# Patient Record
Sex: Male | Born: 1955 | Race: White | Hispanic: No | Marital: Married | Smoking: Never smoker
Health system: Southern US, Community
[De-identification: ages and names within clinical notes are randomized; demographics above are authoritative.]

## PROBLEM LIST (undated history)

## (undated) DIAGNOSIS — J45909 Unspecified asthma, uncomplicated: Secondary | ICD-10-CM

## (undated) DIAGNOSIS — Z87442 Personal history of urinary calculi: Secondary | ICD-10-CM

## (undated) DIAGNOSIS — T7840XA Allergy, unspecified, initial encounter: Secondary | ICD-10-CM

## (undated) DIAGNOSIS — N433 Hydrocele, unspecified: Secondary | ICD-10-CM

## (undated) DIAGNOSIS — F32A Depression, unspecified: Secondary | ICD-10-CM

## (undated) DIAGNOSIS — C801 Malignant (primary) neoplasm, unspecified: Secondary | ICD-10-CM

## (undated) DIAGNOSIS — M419 Scoliosis, unspecified: Secondary | ICD-10-CM

## (undated) DIAGNOSIS — F419 Anxiety disorder, unspecified: Secondary | ICD-10-CM

## (undated) DIAGNOSIS — I10 Essential (primary) hypertension: Secondary | ICD-10-CM

## (undated) DIAGNOSIS — E079 Disorder of thyroid, unspecified: Secondary | ICD-10-CM

## (undated) HISTORY — DX: Depression, unspecified: F32.A

## (undated) HISTORY — PX: COLONOSCOPY: SHX174

## (undated) HISTORY — PX: LITHOTRIPSY: SUR834

## (undated) HISTORY — PX: VASECTOMY: SHX75

## (undated) HISTORY — DX: Unspecified asthma, uncomplicated: J45.909

## (undated) HISTORY — DX: Essential (primary) hypertension: I10

## (undated) HISTORY — DX: Disorder of thyroid, unspecified: E07.9

## (undated) HISTORY — DX: Allergy, unspecified, initial encounter: T78.40XA

## (undated) HISTORY — DX: Malignant (primary) neoplasm, unspecified: C80.1

## (undated) HISTORY — DX: Anxiety disorder, unspecified: F41.9

## (undated) HISTORY — PX: MOHS SURGERY: SHX181

---

## 2009-06-19 DIAGNOSIS — Z87442 Personal history of urinary calculi: Secondary | ICD-10-CM

## 2009-06-19 HISTORY — DX: Personal history of urinary calculi: Z87.442

## 2009-10-24 ENCOUNTER — Emergency Department (HOSPITAL_COMMUNITY): Admission: EM | Admit: 2009-10-24 | Discharge: 2009-10-24 | Payer: Self-pay | Admitting: Emergency Medicine

## 2009-10-25 ENCOUNTER — Ambulatory Visit (HOSPITAL_COMMUNITY): Admission: AD | Admit: 2009-10-25 | Discharge: 2009-10-25 | Payer: Self-pay | Admitting: Urology

## 2010-01-20 ENCOUNTER — Ambulatory Visit (HOSPITAL_COMMUNITY): Admission: RE | Admit: 2010-01-20 | Discharge: 2010-01-20 | Payer: Self-pay | Admitting: Urology

## 2010-03-03 ENCOUNTER — Encounter (INDEPENDENT_AMBULATORY_CARE_PROVIDER_SITE_OTHER): Payer: Self-pay | Admitting: *Deleted

## 2010-03-21 ENCOUNTER — Ambulatory Visit: Payer: Self-pay | Admitting: Family Medicine

## 2010-03-21 DIAGNOSIS — R519 Headache, unspecified: Secondary | ICD-10-CM | POA: Insufficient documentation

## 2010-03-21 DIAGNOSIS — M4120 Other idiopathic scoliosis, site unspecified: Secondary | ICD-10-CM | POA: Insufficient documentation

## 2010-03-21 DIAGNOSIS — Z87442 Personal history of urinary calculi: Secondary | ICD-10-CM | POA: Insufficient documentation

## 2010-03-21 DIAGNOSIS — R51 Headache: Secondary | ICD-10-CM | POA: Insufficient documentation

## 2010-03-22 LAB — CONVERTED CEMR LAB
AST: 28 units/L (ref 0–37)
Albumin: 4.7 g/dL (ref 3.5–5.2)
Bilirubin, Direct: 0.1 mg/dL (ref 0.0–0.3)
Calcium: 9.4 mg/dL (ref 8.4–10.5)
Chloride: 100 meq/L (ref 96–112)
Free T4: 0.66 ng/dL (ref 0.60–1.60)
GFR calc non Af Amer: 103.38 mL/min (ref >60)
Glucose, Bld: 85 mg/dL (ref 70–99)
Lymphs Abs: 2.1 10*3/uL (ref 0.7–4.0)
Monocytes Absolute: 0.6 10*3/uL (ref 0.1–1.0)
PSA: 0.74 ng/mL (ref 0.10–4.00)
Potassium: 4 meq/L (ref 3.5–5.1)
RBC: 4.48 M/uL (ref 4.22–5.81)
Sodium: 138 meq/L (ref 135–145)
T3, Free: 2.9 pg/mL (ref 2.3–4.2)
Total Bilirubin: 0.7 mg/dL (ref 0.3–1.2)
Total CHOL/HDL Ratio: 4
Total Protein: 7.6 g/dL (ref 6.0–8.3)
Triglycerides: 253 mg/dL — ABNORMAL HIGH (ref 0.0–149.0)
WBC: 8.1 10*3/uL (ref 4.5–10.5)

## 2010-03-23 ENCOUNTER — Encounter: Payer: Self-pay | Admitting: Gastroenterology

## 2010-04-18 ENCOUNTER — Encounter (INDEPENDENT_AMBULATORY_CARE_PROVIDER_SITE_OTHER): Payer: Self-pay | Admitting: *Deleted

## 2010-04-20 ENCOUNTER — Ambulatory Visit: Payer: Self-pay | Admitting: Gastroenterology

## 2010-05-04 ENCOUNTER — Ambulatory Visit: Payer: Self-pay | Admitting: Gastroenterology

## 2010-05-06 ENCOUNTER — Encounter: Payer: Self-pay | Admitting: Gastroenterology

## 2010-07-19 NOTE — Assessment & Plan Note (Signed)
Summary: NEW TO EST,WANTS CPX,UHC/RH.......  Flu Vaccine Consent Questions     Do you have a history of severe allergic reactions to this vaccine? no    Any prior history of allergic reactions to egg and/or gelatin? no    Do you have a sensitivity to the preservative Thimersol? no    Do you have a past history of Guillan-Barre Syndrome? no    Do you currently have an acute febrile illness? no    Have you ever had a severe reaction to latex? no    Vaccine information given and explained to patient? yes    Are you currently pregnant? no    Lot Number:[id number]   Exp Date:12/17/2010   Site Given  Right Deltoid IM    Vital Signs:  Patient profile:   55 year old male Height:      70.25 inches Weight:      210 pounds BMI:     30.03 Pulse rate:   74 / minute BP sitting:   112 / 80  (left arm)  Vitals Entered By: Doristine Devoid CMA (March 21, 2010 10:34 AM) CC: NEW EST- CPX AND LABS   History of Present Illness: 55 yo man here today for CPE.  previous MD- none.  Urologist- [facility]  scoliosis- sees Chiropractic regularly.  causes frequent headaches.  previously on muscle relaxers and vicodin with good relief.  Preventive Screening-Counseling & Management  Alcohol-Tobacco     Alcohol drinks/day: <1     Smoking Status: never  Caffeine-Diet-Exercise     Does Patient Exercise: yes     Type of exercise: walking      Sexual History:  currently monogamous.        Drug Use:  never.    Current Medications (verified): 1)  None  Allergies (verified): No Known Drug Allergies  Past History:  Past Medical History: Scoliosis- Dr. Melvyn Neth (Chiropractor) Nephrolithiasis, hx of  Past Surgical History: Lithotripsy-2011, Dr. 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  The patient denies anorexia, fever, weight loss, weight gain, vision loss, decreased hearing, hoarseness, chest pain, syncope, dyspnea on exertion, peripheral edema, prolonged cough, abdominal pain, melena, hematochezia, severe indigestion/heartburn, hematuria, suspicious skin lesions, depression, abnormal bleeding, enlarged lymph nodes, and testicular masses.    Physical Exam  General:  Well-developed,well-nourished,in no acute distress; alert,appropriate and cooperative throughout examination Head:  Normocephalic and atraumatic without obvious abnormalities. Eyes:  PERRL, EOMI, wearing glasses Ears:  External ear exam shows no significant lesions or deformities.  Otoscopic examination reveals clear canals, tympanic membranes are intact bilaterally without bulging, retraction, inflammation or discharge. Hearing is grossly normal bilaterally. Nose:  External nasal examination shows no deformity or inflammation. Nasal mucosa are pink and moist without lesions or exudates. Mouth:  Oral mucosa and oropharynx without lesions or exudates. Neck:  No deformities, masses, or tenderness noted. Lungs:  Normal respiratory effort, chest expands symmetrically. Lungs are clear to auscultation, no crackles or wheezes. Heart:  reg S1/S2, no M/R/G Abdomen:  Bowel sounds positive,abdomen soft and non-tender without masses, organomegaly or hernias noted. Rectal:  No external abnormalities noted. Normal sphincter tone. No rectal masses or tenderness. Genitalia:  Testes bilaterally descended without nodularity,  tenderness or masses. No scrotal masses or lesions. No penis lesions or urethral discharge. Prostate:  Prostate gland firm and smooth, no enlargement, nodularity, tenderness, mass, asymmetry or induration. Pulses:  +2 carotid, radial, DP Extremities:  No clubbing, cyanosis, edema, or deformity noted with normal full  range of motion of all joints.   Neurologic:  No cranial nerve deficits noted. Station and gait are normal. Plantar reflexes are down-going bilaterally. DTRs are symmetrical throughout. Sensory, motor and coordinative functions appear intact. Skin:  Intact without suspicious lesions or rashes Cervical Nodes:  No lymphadenopathy noted Inguinal Nodes:  No significant adenopathy Psych:  Cognition and judgment appear intact. Alert and cooperative with normal attention span and concentration. No apparent delusions, illusions, hallucinations   Impression & Recommendations:  Problem # 1:  PHYSICAL EXAMINATION (ICD-V70.0) Assessment New pt's PE WNL.  check labs.  due for colonoscopy- will refer.   Orders: Venipuncture (24401) TLB-Lipid Panel (80061-LIPID) TLB-BMP (Basic Metabolic Panel-BMET) (80048-METABOL) TLB-CBC Platelet - w/Differential (85025-CBCD) TLB-Hepatic/Liver Function Pnl (80076-HEPATIC) TLB-TSH (Thyroid Stimulating Hormone) (84443-TSH) TLB-PSA (Prostate Specific Antigen) (84153-PSA) Gastroenterology Referral (GI)  Problem # 2:  SCOLIOSIS (ICD-737.30) Assessment: New following w/ chiropractic.  pt reports this is the cause of recurrent HAs.  Problem # 3:  HEADACHE (ICD-784.0) Assessment: New pt's HAs are caused by abnormal spine alignment due to scoliosis.  start Flexeril as needed for back/neck pain to try and lessen HAs.  will continue to follow.  Complete Medication List: 1)  Cyclobenzaprine Hcl 10 Mg Tabs (Cyclobenzaprine hcl) .Marland Kitchen.. 1 by mouth 3 times daily as needed for back pain  Other Orders: Admin 1st Vaccine (02725) Flu Vaccine 68yrs + (36644) Tdap => 83yrs IM (03474) Admin of Any Addtl Vaccine (25956)  Patient Instructions: 1)  We'll notify you of your lab results 2)  Take the Flexeril as needed for headaches 3)  Call with any questions or concerns 4)  Someone will call you with your GI appt to discuss colonoscopy 5)  Your exam looks great!  Try and make  healthy food choices and get regular exercise 6)  Welcome!  We're glad to have you! Prescriptions: CYCLOBENZAPRINE HCL 10 MG  TABS (CYCLOBENZAPRINE HCL) 1 by mouth 3 times daily as needed for back pain  #30 x 0   Entered and Authorized by:   Neena Rhymes MD   Signed by:   Neena Rhymes MD on 03/21/2010   Method used:   Electronically to        [facility] [address] #[id number]* (retail)       [address]       [place], [place]  [address]       Ph: [phone]       Fax: [phone]   RxID:   [id number]    Immunizations Administered:  Tetanus Vaccine:    Vaccine Type: Tdap    Site: left deltoid    Mfr: GlaxoSmithKline    Dose: 0.5 ml    Route: IM    Given by: Doristine Devoid CMA    Exp. Date: 04/07/2012    Lot #: [id number]

## 2010-07-19 NOTE — Procedures (Signed)
Summary: Colonoscopy  Patient: [name] Note: All result statuses are Final unless otherwise noted.  Tests: (1) Colonoscopy (COL)   COL Colonoscopy           DONE     [facility]     [address]     [place], [place]  [address]           COLONOSCOPY PROCEDURE REPORT           PATIENT:  Nicholas Moody, Nicholas Moody  MR#:  [id number]     BIRTHDATE:  May 14, 1956, 53 yrs. old  GENDER:  male     ENDOSCOPIST:  Vania Rea. Jarold Motto, MD, [facility]     REF. BY:  Helane Rima. Beverely Low, M.D.     PROCEDURE DATE:  05/04/2010     PROCEDURE:  Higher-risk screening colonoscopy G0105           ASA CLASS:  Class I     INDICATIONS:  surveillance and high-risk screening FATHER HAD     COLOC CA.     MEDICATIONS:   Fentanyl 75 mcg IV, Versed 8 mg IV           DESCRIPTION OF PROCEDURE:   After the risks benefits and     alternatives of the procedure were thoroughly explained, informed     consent was obtained.  Digital rectal exam was performed and     revealed no abnormalities.   The LB [id number] endoscope     was introduced through the anus and advanced to the cecum, which     was identified by both the appendix and ileocecal valve, without     limitations.  The quality of the prep was excellent, using     MoviPrep.  The instrument was then slowly withdrawn as the colon     was fully examined.     <<PROCEDUREIMAGES>>           FINDINGS:  Mild diverticulosis was found in the sigmoid to     descending colon segments.  A sessile polyp was found. SMALL 2 MM     POLYP COLD BX.  Internal and external hemorrhoids were found.     Retroflexed views in the rectum revealed no abnormalities.    The     scope was then withdrawn from the patient and the procedure     completed.           COMPLICATIONS:  None     ENDOSCOPIC IMPRESSION:     1) Mild diverticulosis in the sigmoid to descending colon     segments     2) Sessile polyp     3) Internal and external hemorrhoids     R/O ADENOMA.  RECOMMENDATIONS:     1) Repeat Colonoscopy in 5 years.     2) high fiber diet     REPEAT EXAM:  No           ______________________________     Vania Rea. Jarold Motto, MD, [facility]           CC:           n.     eSIGNED:   Vania Rea. Patterson at 05/04/2010 12:24 PM           Wende Crease, [id number]  Note: An exclamation mark (!) indicates a result that was not dispersed into the flowsheet. Document Creation Date: 05/04/2010 12:24 PM _______________________________________________________________________  (1) Order result status: Final Collection or observation date-time: 05/04/2010 12:10 Requested date-time:  Receipt date-time:  Reported date-time:  Referring Physician:   Ordering Physician: Sheryn Bison [phone]) Specimen Source:  Source: Launa Grill Order Number: [id number] Lab site:   Appended Document: Colonoscopy 5Y F/U  Appended Document: Colonoscopy     Procedures Next Due Date:    Colonoscopy: 04/2015

## 2010-07-19 NOTE — Letter (Signed)
Summary: [facility] Instructions  [facility]  [address] [place], [place] [address]   Phone: [phone]  Fax: [phone]       TEAL BONTRAGER    07-30-1955    MRN: [id number]        Procedure Day Dorna Bloom:  Wednesday 05/04/2010     Arrival Time: 10:30 am      Procedure Time: 11:30 am     Location of Procedure:                    _x _  [facility] (4th Floor)                        PREPARATION FOR COLONOSCOPY WITH MOVIPREP   Starting 5 days prior to your procedure Friday 11/11 do not eat nuts, seeds, popcorn, corn, beans, peas,  salads, or any raw vegetables.  Do not take any fiber supplements (e.g. Metamucil, Citrucel, and Benefiber).  THE DAY BEFORE YOUR PROCEDURE         DATE: Tuesday 11/15  1.  Drink clear liquids the entire day-NO SOLID FOOD  2.  Do not drink anything colored red or purple.  Avoid juices with pulp.  No orange juice.  3.  Drink at least 64 oz. (8 glasses) of fluid/clear liquids during the day to prevent dehydration and help the prep work efficiently.  CLEAR LIQUIDS INCLUDE: Water Jello Ice Popsicles Tea (sugar ok, no milk/cream) Powdered fruit flavored drinks Coffee (sugar ok, no milk/cream) Gatorade Juice: apple, white grape, white cranberry  Lemonade Clear bullion, consomm, broth Carbonated beverages (any kind) Strained chicken noodle soup Hard Candy                             4.  In the morning, mix first dose of MoviPrep solution:    Empty 1 Pouch A and 1 Pouch B into the disposable container    Add lukewarm drinking water to the top line of the container. Mix to dissolve    Refrigerate (mixed solution should be used within 24 hrs)  5.  Begin drinking the prep at 5:00 p.m. The MoviPrep container is divided by 4 marks.   Every 15 minutes drink the solution down to the next mark (approximately 8 oz) until the full liter is complete.   6.  Follow completed prep with 16 oz of clear liquid of your choice  (Nothing red or purple).  Continue to drink clear liquids until bedtime.  7.  Before going to bed, mix second dose of MoviPrep solution:    Empty 1 Pouch A and 1 Pouch B into the disposable container    Add lukewarm drinking water to the top line of the container. Mix to dissolve    Refrigerate  THE DAY OF YOUR PROCEDURE      DATE: Wednesday 11/16  Beginning at 6:30 a.m. (5 hours before procedure):         1. Every 15 minutes, drink the solution down to the next mark (approx 8 oz) until the full liter is complete.  2. Follow completed prep with 16 oz. of clear liquid of your choice.    3. You may drink clear liquids until 9:30 am (2 HOURS BEFORE PROCEDURE).   MEDICATION INSTRUCTIONS  Unless otherwise instructed, you should take regular prescription medications with a small sip of water   as early as possible the morning of  your procedure.           OTHER INSTRUCTIONS  You will need a responsible adult at least 57 years of age to accompany you and drive you home.   This person must remain in the waiting room during your procedure.  Wear loose fitting clothing that is easily removed.  Leave jewelry and other valuables at home.  However, you may wish to bring a book to read or  an iPod/MP3 player to listen to music as you wait for your procedure to start.  Remove all body piercing jewelry and leave at home.  Total time from sign-in until discharge is approximately 2-3 hours.  You should go home directly after your procedure and rest.  You can resume normal activities the  day after your procedure.  The day of your procedure you should not:   Drive   Make legal decisions   Operate machinery   Drink alcohol   Return to work  You will receive specific instructions about eating, activities and medications before you leave.    The above instructions have been reviewed and explained to me by   Ezra Sites RN  April 20, 2056 9:32 AM     I fully  understand and can verbalize these instructions _____________________________ Date _________

## 2010-07-19 NOTE — Letter (Signed)
Summary: New Patient Letter  [facility]  [address] [place], [place] [address]   Phone: [phone]  Fax: [phone]       03/03/2010 MRN: [id number]  Nicholas Moody [address] [place], [place]  [address]  Dear [name],   Welcome to [facility] and thank you for choosing [place] as your Primary Care Providers. Enclosed you will find information about our practice that we hope you find helpful. We have also enclosed forms to be filled out prior to your visit. This will provide [place] with the necessary information and facilitate your being seen in a timely manner. If you have any questions, please call us at: [phone] and we will be happy to assist you. We look forward to seeing you at your scheduled appointment time.  Appointment Monday, October 3, at 10:30am  with Dr. Neena Rhymes   Sincerely,  Primary Health Care Team  Please arrive 15 minutes early for your first appointment and bring your insurance card. Co-pay is required at the time of your visit.  *****Please call the office if you are not able to keep this appointment. There is a charge of $50.00 if any appointment is not cancelled or rescheduled within 24 hours*****

## 2010-07-19 NOTE — Letter (Signed)
Summary: Patient Notice- Polyp Results  [facility]  [address] [place], [place] [address]   Phone: [phone]  Fax: [phone]        May 06, 2010 MRN: [id number]    KAMREN HEINTZELMAN [address] [place], [place]  [address]    Dear Mr. Shell,  I am pleased to inform you that the colon polyp(s) removed during your recent colonoscopy was (were) found to be benign (no cancer detected) upon pathologic examination.  I recommend you have a repeat colonoscopy examination in 5_ years to look for recurrent polyps, as having colon polyps increases your risk for having recurrent polyps or even colon cancer in the future.  Should you develop new or worsening symptoms of abdominal pain, bowel habit changes or bleeding from the rectum or bowels, please schedule an evaluation with either your primary care physician or with me.  Additional information/recommendations:  _X_ No further action with gastroenterology is needed at this time. Please      follow-up with your primary care physician for your other healthcare      needs.  __ Please call [phone] to schedule a return visit to review your      situation.  __ Please keep your follow-up visit as already scheduled.  __ Continue treatment plan as outlined the day of your exam.  Please call us if you are having persistent problems or have questions about your condition that have not been fully answered at this time.  Sincerely,  Mardella Layman MD [facility]  This letter has been electronically signed by your physician.  Appended Document: Patient Notice- Polyp Results Letter mailed

## 2010-07-19 NOTE — Miscellaneous (Signed)
Summary: [facility]  Clinical Lists Changes  Medications: Added new medication of MOVIPREP 100 GM  SOLR (PEG-KCL-NACL-NASULF-NA ASC-C) As per prep instructions. - Signed Rx of MOVIPREP 100 GM  SOLR (PEG-KCL-NACL-NASULF-NA ASC-C) As per prep instructions.;  #1 x 0;  Signed;  Entered by: Ezra Sites RN;  Authorized by: Mardella Layman MD [facility];  Method used: Electronically to [id number]*, [address] [place], [place], [place]  [address], Ph: [phone], Fax: [phone] Observations: Added new observation of NKA: T (04/20/2010 8:38)    Prescriptions: MOVIPREP 100 GM  SOLR (PEG-KCL-NACL-NASULF-NA ASC-C) As per prep instructions.  #1 x 0   Entered by:   Ezra Sites RN   Authorized by:   Mardella Layman MD [facility]   Signed by:   Ezra Sites RN on 04/20/2010   Method used:   Electronically to        [facility] [address] [id number]* (retail)       [address]       [place], [place]  [address]       Ph: [phone]       Fax: [phone]   RxID:   [id number]

## 2010-07-19 NOTE — Letter (Signed)
Summary: Pre Visit Letter Revised  [facility]  [address] [place], [place] [address]   Phone: [phone]  Fax: [phone]        03/23/2010 MRN: [id number]  Nicholas Moody [address] [place], [place]  [address]                            Procedure Date:  11-16 at 11:30am Welcome to the Gastroenterology Division at [facility]    You are scheduled to see a nurse for your pre-procedure visit on 04-20-10 at  9am on the 3rd floor at [facility], [address]  We ask that you try to arrive at our office 15 minutes prior to your appointment time to allow for check-in.  Please take a minute to review the attached form.  If you answer "Yes" to one or more of the questions on the first page, we ask that you call the person listed at your earliest opportunity.  If you answer "No" to all of the questions, please complete the rest of the form and bring it to your appointment.    Your nurse visit will consist of discussing your medical and surgical history, your immediate family medical history, and your medications.   If you are unable to list all of your medications on the form, please bring the medication bottles to your appointment and we will list them.  We will need to be aware of both prescribed and over the counter drugs.  We will need to know exact dosage information as well.    Please be prepared to read and sign documents such as consent forms, a financial agreement, and acknowledgement forms.  If necessary, and with your consent, a friend or relative is welcome to sit-in on the nurse visit with you.  Please bring your insurance card so that we may make a copy of it.  If your insurance requires a referral to see a specialist, please bring your referral form from your primary care physician.  No co-pay is required for this nurse visit.     If you cannot keep your appointment, please call [phone] to cancel or reschedule prior to your appointment  date.  This allows [place] the opportunity to schedule an appointment for another patient in need of care.    Thank you for choosing [facility] for your medical needs.  We appreciate the opportunity to care for you.  Please visit [place] at our website  to learn more about our practice.  Sincerely, The Gastroenterology Division

## 2010-09-06 LAB — URINALYSIS, ROUTINE W REFLEX MICROSCOPIC
Bilirubin Urine: NEGATIVE
Leukocytes, UA: NEGATIVE
Specific Gravity, Urine: 1.021 (ref 1.005–1.030)
Urobilinogen, UA: 1 mg/dL (ref 0.0–1.0)
pH: 6.5 (ref 5.0–8.0)

## 2010-09-06 LAB — URINE MICROSCOPIC-ADD ON

## 2010-09-06 LAB — CBC
Hemoglobin: 14.9 g/dL (ref 13.0–17.0)
MCV: 94.8 fL (ref 78.0–100.0)
RBC: 4.39 MIL/uL (ref 4.22–5.81)
RDW: 12.6 % (ref 11.5–15.5)
WBC: 13.6 10*3/uL — ABNORMAL HIGH (ref 4.0–10.5)

## 2010-09-06 LAB — POCT I-STAT, CHEM 8
BUN: 10 mg/dL (ref 6–23)
Calcium, Ion: 1.07 mmol/L — ABNORMAL LOW (ref 1.12–1.32)
Chloride: 105 mEq/L (ref 96–112)
Sodium: 138 mEq/L (ref 135–145)
TCO2: 22 mmol/L (ref 0–100)

## 2010-09-06 LAB — DIFFERENTIAL
Eosinophils Relative: 1 % (ref 0–5)
Monocytes Relative: 5 % (ref 3–12)
Neutrophils Relative %: 81 % — ABNORMAL HIGH (ref 43–77)

## 2010-12-17 IMAGING — CT CT ABD-PELV W/O CM
3 of 4 series · 14 of 32 positions shown, 19 images · non-contrast
Comparison: None.

CLINICAL DATA: Right flank pain.

CT ABDOMEN AND PELVIS WITHOUT CONTRAST
TECHNIQUE: Multidetector CT imaging of the abdomen and pelvis was
performed following the standard protocol without intravenous
contrast.

[Series 2: renal stone · axial · 0.86mm/px · z∈[-414,-129]mm · 4 of 97 slices shown, 9 images]
[im 20/97  soft-tissue]
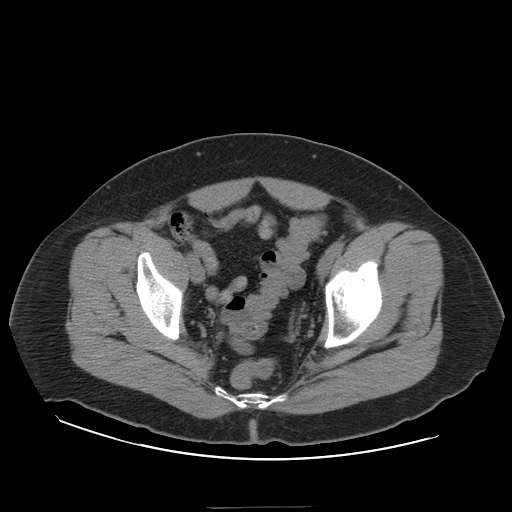
[im 20/97  lung]
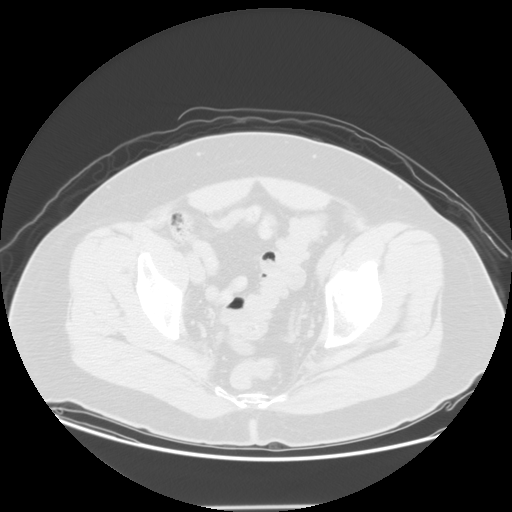
[im 20/97  bone]
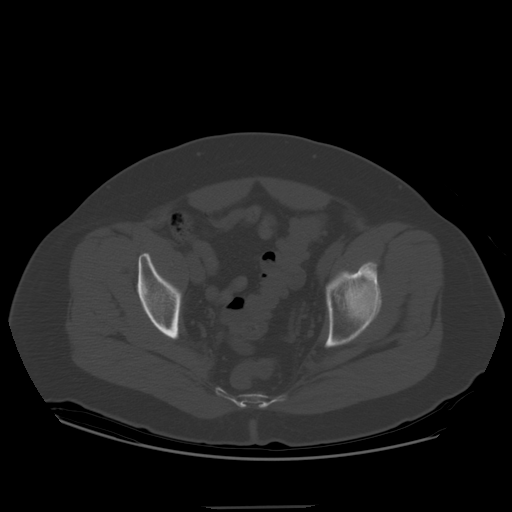
[im 39/97  soft-tissue]
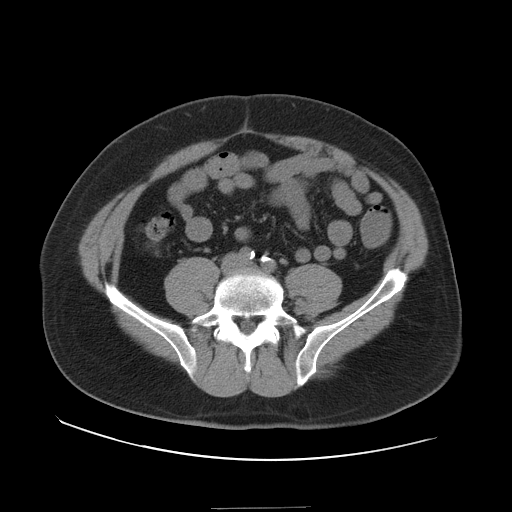
[im 39/97  lung]
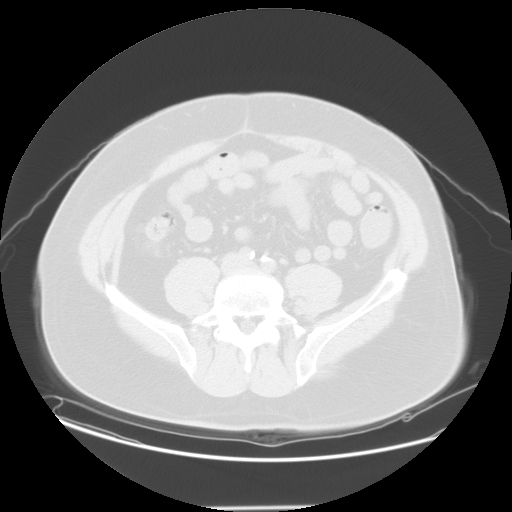
[im 58/97  soft-tissue]
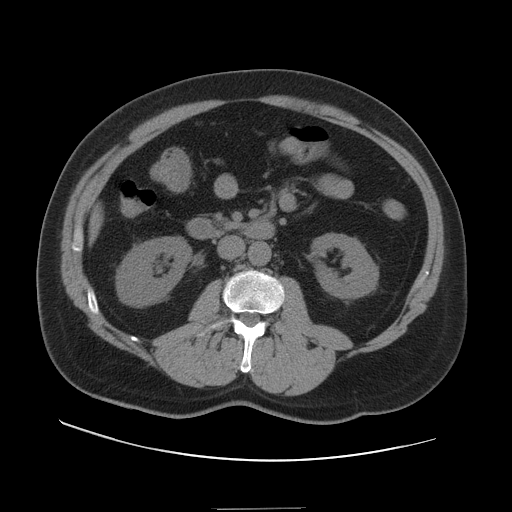
[im 58/97  lung]
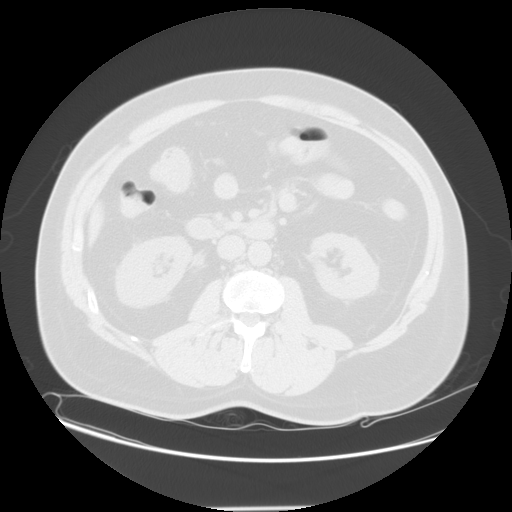
[im 77/97  soft-tissue]
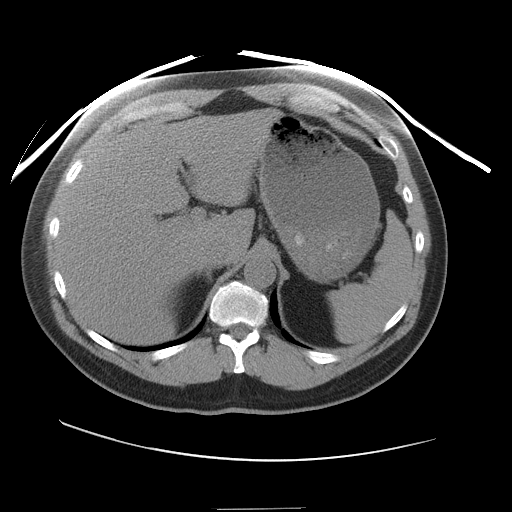
[im 77/97  lung]
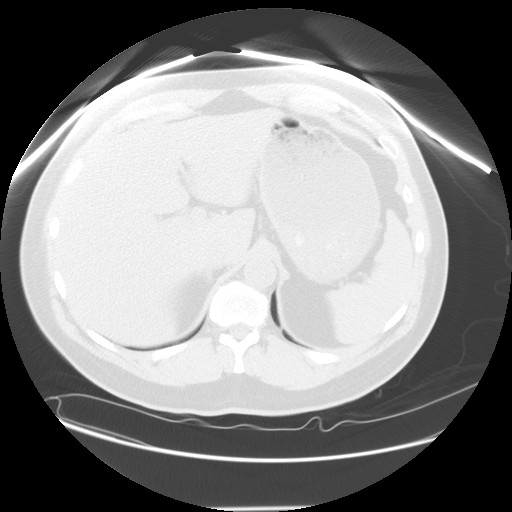

[Series 400: reformatted · sagittal · 1.00mm/px · 8 of 206 slices shown (1 of 2)]
[im 18/206  soft-tissue]
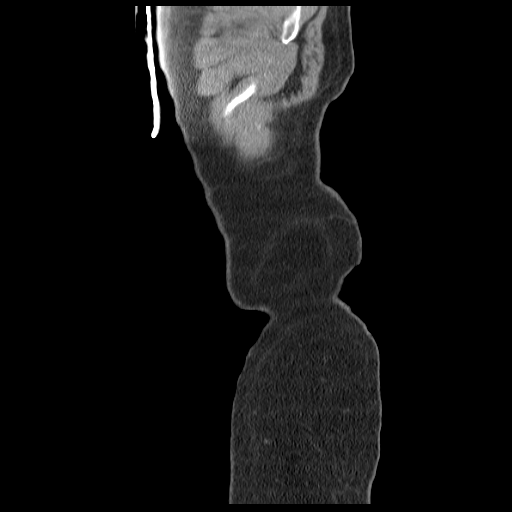
[im 52/206  soft-tissue]
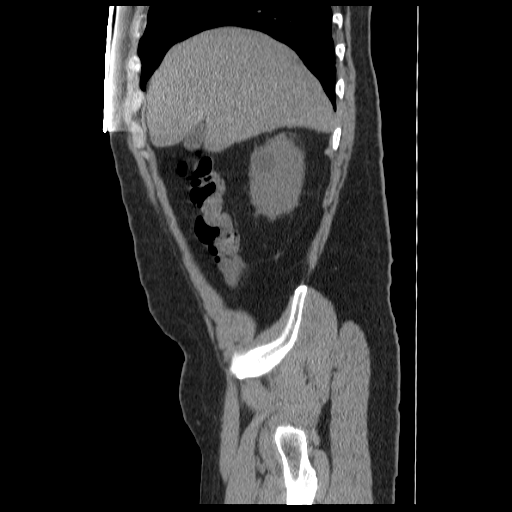
[im 69/206  soft-tissue]
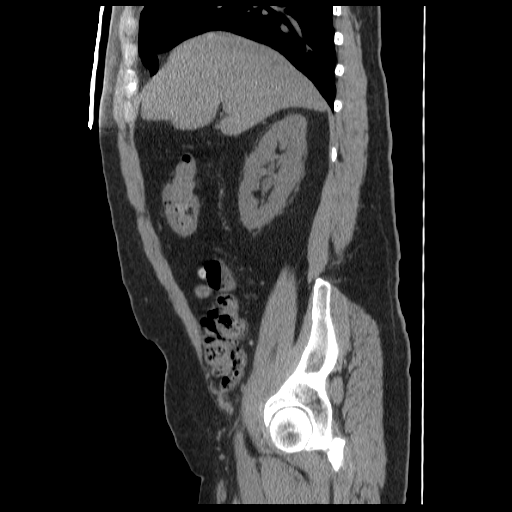
[im 86/206  soft-tissue]
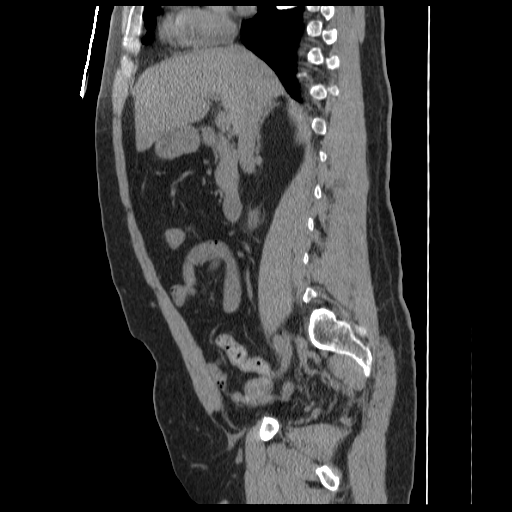
[im 120/206  soft-tissue]
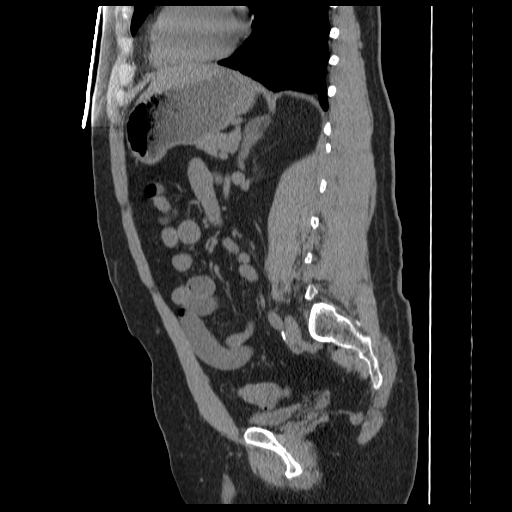
[im 137/206  soft-tissue]
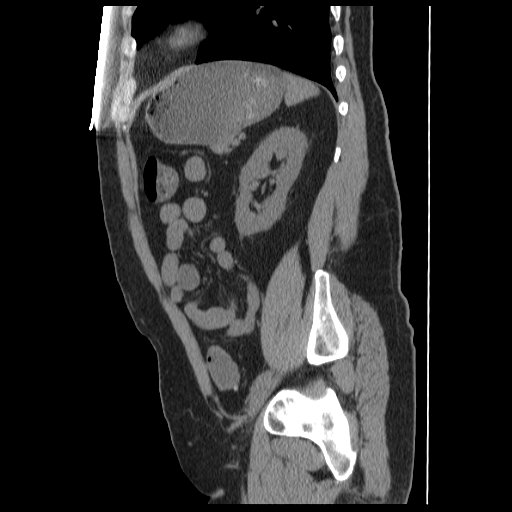
[im 154/206  soft-tissue]
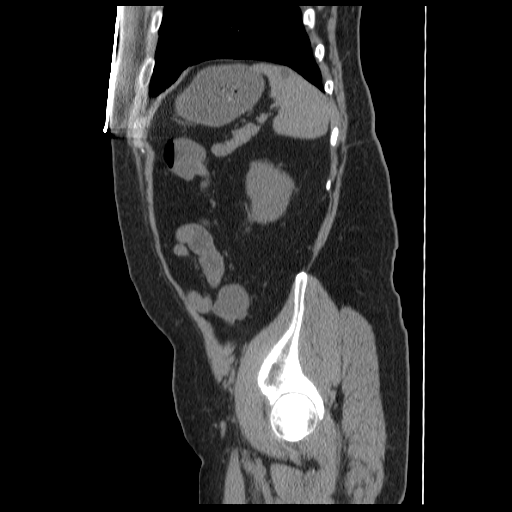
[im 188/206  soft-tissue]
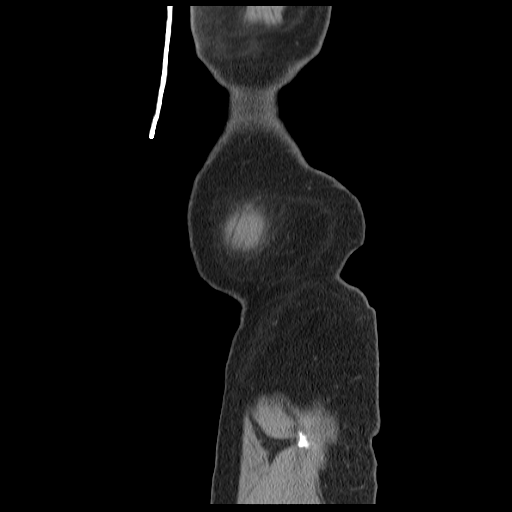

[Series 401: reformatted · coronal · 1.00mm/px · 2 of 169 slices shown (2 of 2)]
[im 17/169  soft-tissue]
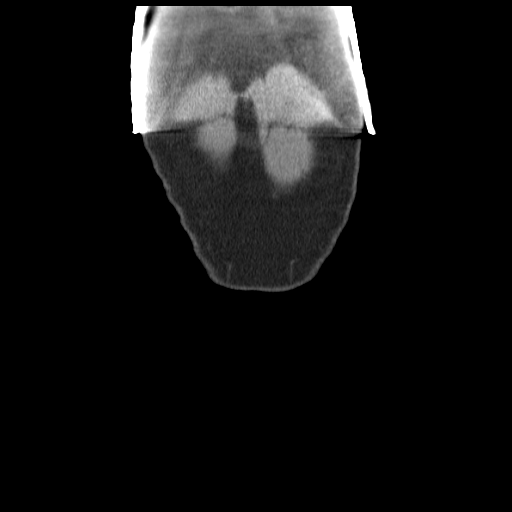
[im 34/169  soft-tissue]
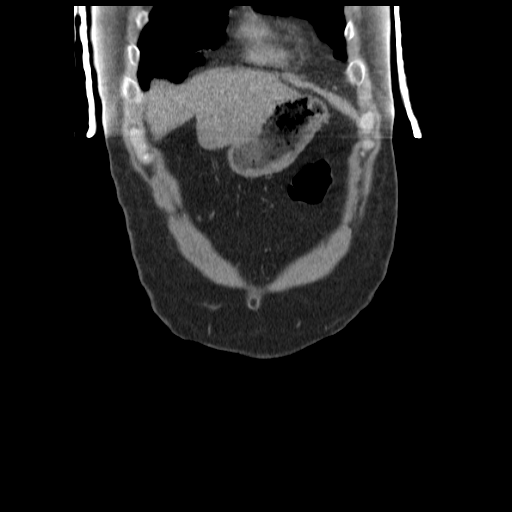

[14 of 32 positions shown; findings below may reference images not displayed]

FINDINGS: There is a calculus in the mid right ureter.  This
measures 4.6 x 7.2 mm and is causing moderate right hydronephrosis.
No other renal calculi on the right.  There is a nonobstructing 3
mm stone in the left lower pole.  Right upper pole renal cyst is
present.

2 cm hepatic cyst is noted in the right lobe.  Remainder of the
liver is normal.  The gallbladder pancreas and spleen are normal.

The bowel is normal and there is no bowel obstruction.  The
appendix is normal.  There is no free fluid or mass.
IMPRESSION: 4.6 x 7.2 mm stone in the mid right ureter causing obstruction of
the right kidney.

Nonobstructing 3 mm stone in the left lower pole.

## 2010-12-18 IMAGING — CR DG ABDOMEN 1V
1 series · 1 of 1 positions shown · non-contrast
Comparison: CT 10/24/2009

CLINICAL DATA: Right kidney stone.  Preop.

ABDOMEN - 1 VIEW

[t abdomen supine]
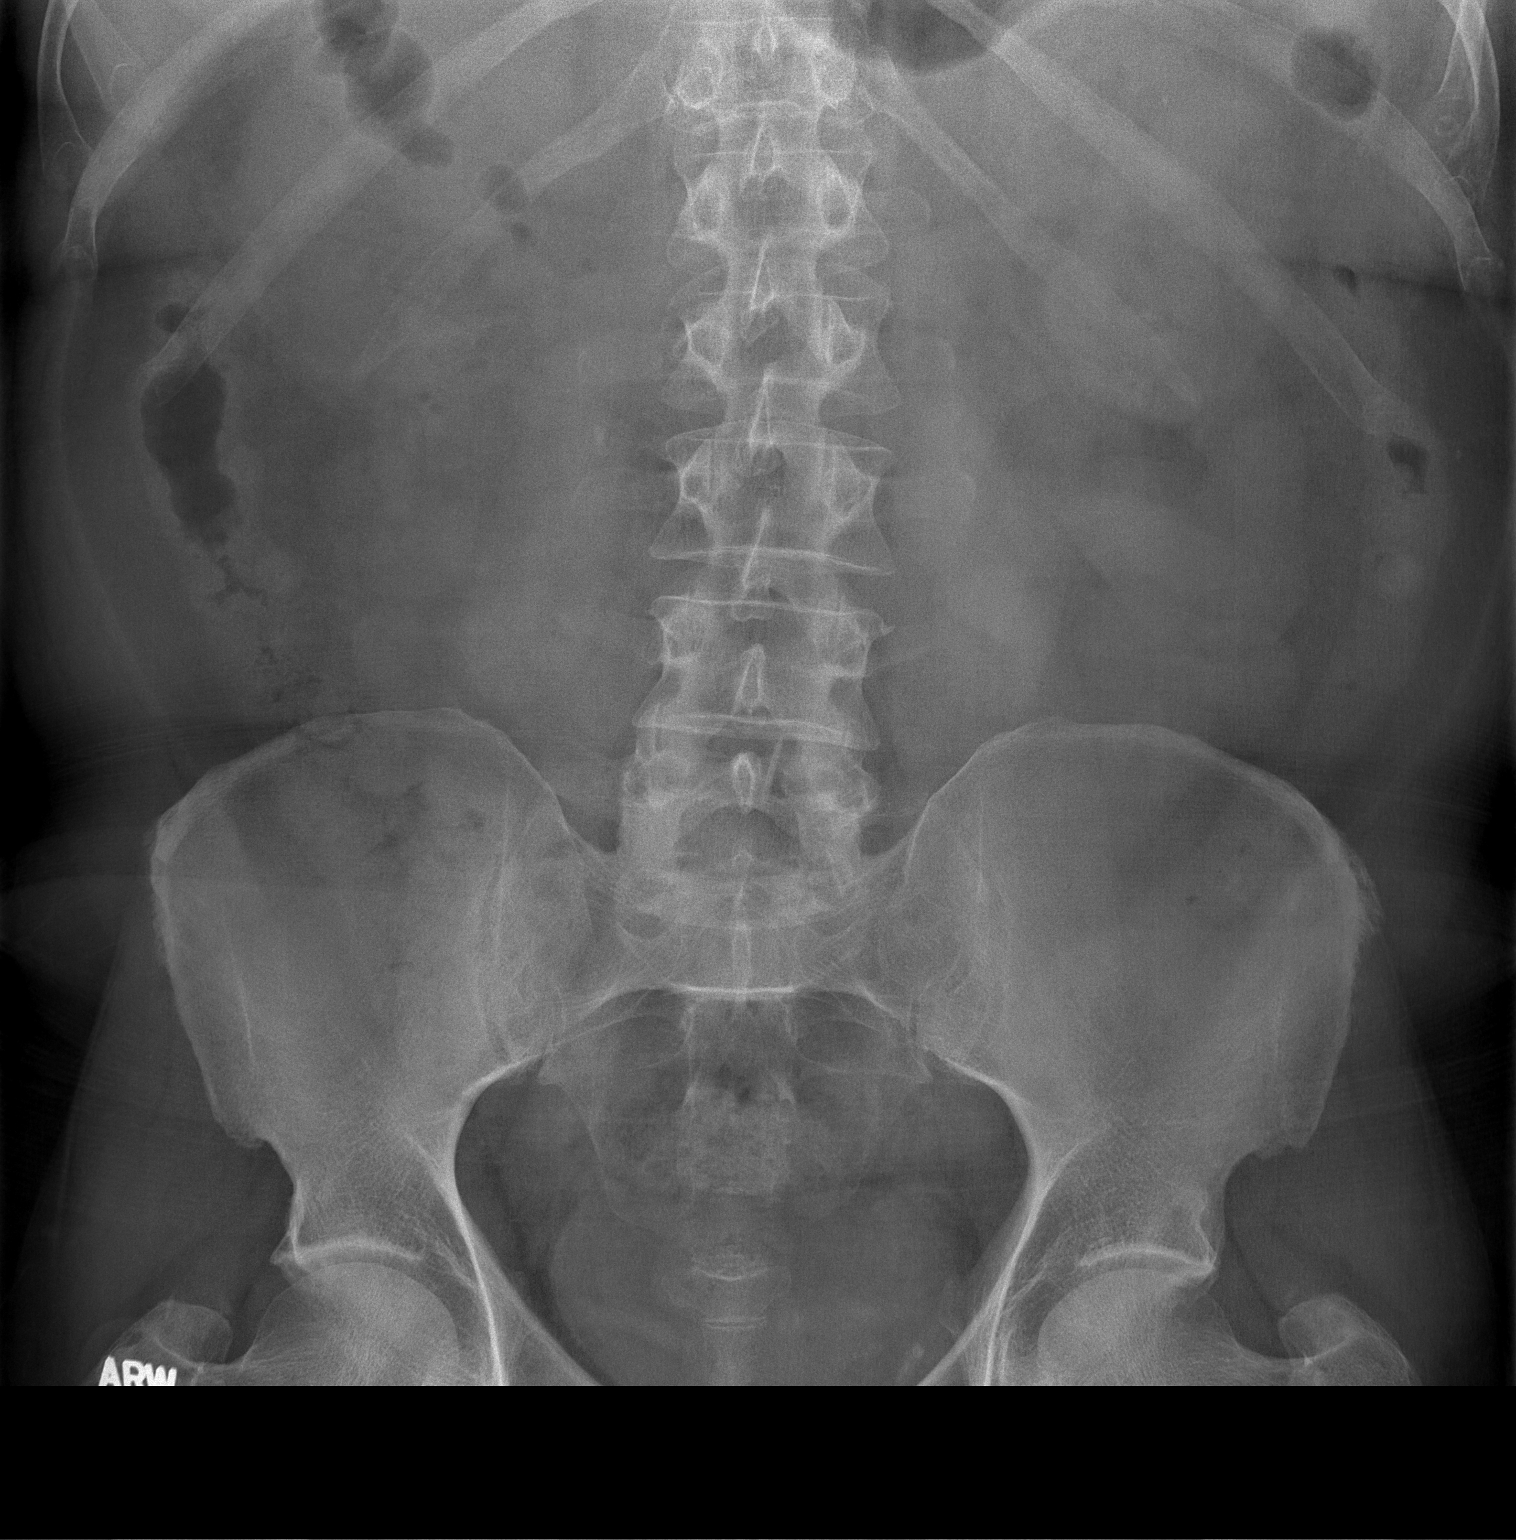

[1 of 1 positions shown; findings below may reference images not displayed]

FINDINGS: 3 x 6 mm calcification to  the right of  L2-3 corresponds
to the ureteral calculus on the CT scan.  This is only faintly
visualized on the radiograph.  No other renal calculi identified.

Normal bowel gas pattern.  No acute bony abnormality.
IMPRESSION: Approximately 3 x 6 mm calculus in the proximal right ureter.

## 2011-10-12 ENCOUNTER — Other Ambulatory Visit: Payer: Self-pay | Admitting: Urology

## 2011-10-16 ENCOUNTER — Encounter (HOSPITAL_BASED_OUTPATIENT_CLINIC_OR_DEPARTMENT_OTHER): Payer: Self-pay | Admitting: *Deleted

## 2011-10-16 NOTE — Progress Notes (Signed)
To [facility] @ 1100.Hg,Ekg on arrival.Npo after MN-

## 2011-10-18 NOTE — H&P (Signed)
Problems Problems  1. Epididymitis Left 604.90 2. Hypogonadism 257.2 3. Libido, Decreased 799.81 4. Nephrolithiasis 592.0 5. 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 returns today in f/u.  He was seen on Monday for left epididymitis with a reactive hydrocele.  He was given doxycycline and hydrocodone but has not had improvement in his symptoms.   He is now having some moderate to severe burning in the scrotum with voiding.  He has no other associated signs or symptoms..   Past Medical History Problems  1. History of  Distal Ureteral Stone On The Left 592.1 2. History of  Epididymitis Left 604.90 3. History of  Nephrolithiasis V13.01 4. History of  Proximal Ureteral Stone On The Right 592.1 5. History of  Ureteral Stone Right 592.1  Surgical History Problems  1. History of  Lithotripsy 2. History of  Lithotripsy 3. History of  Surgery Of Male Genitalia Vasectomy V25.2  Current Meds 1. Doxycycline Hyclate 100 MG Oral Capsule; TAKE 1 CAPSULE TWICE DAILY; Therapy: 22Apr2013  to (Evaluate:22May2013)  Requested for: 22Apr2013; Last Rx:22Apr2013 2. Multi-Vitamin TABS; Therapy: (Recorded:24Oct2012) to  Allergies Medication  1. No Known Drug Allergies  Family History Problems  1. Paternal history of  Bone Cancer 2. Family history of  Death In The Family Father deceased age 66--bone cancer 3. Family history of  Death In The Family Mother deceased age 20--cancer 4. Maternal history of  Kidney Cancer V16.51 5. Paternal history of  Nonfunctioning Kidney Denied  6. Family history of  Family Health Status Number Of Children  Social History Problems  1. Alcohol Use 2 per day 2. Marital History - Currently Married 3. Never A Smoker 4. Occupation: IT trainer  5. History of  Caffeine Use 6. History of  Tobacco Use  Review of Systems  Gastrointestinal: no nausea.  Constitutional: no fever.    Physical Exam Constitutional: Well  nourished and well developed . No acute distress.  Pulmonary: No respiratory distress and normal respiratory rhythm and effort.  Cardiovascular: Heart rate and rhythm are normal . No peripheral edema.  Genitourinary: Examination of the penis demonstrates no discharge, no masses, no lesions and a normal meatus. The scrotum is tender, but without lesions. Examination of the left scrotum demostrates a hydrocele. The right epididymis is palpably normal and non-tender. The right testis is palpably normal. The left testis is nonpalpable.  Because of the very hard hydrocele.    Results/Data Urine [Data Includes: Last 1 Day]   25Apr2013  COLOR YELLOW   APPEARANCE CLEAR   SPECIFIC GRAVITY <1.005   pH 5.5   GLUCOSE NEG mg/dL  BILIRUBIN NEG   KETONE NEG mg/dL  BLOOD TRACE   PROTEIN NEG mg/dL  UROBILINOGEN 0.2 mg/dL  NITRITE NEG   LEUKOCYTE ESTERASE NEG   SQUAMOUS EPITHELIAL/HPF NONE SEEN   WBC NONE SEEN WBC/hpf  RBC 0-3 RBC/hpf  BACTERIA NONE SEEN   CRYSTALS NONE SEEN   CASTS NONE SEEN    The following images/tracing/specimen were independently visualized:  I have reviewed his Scrotal US today and compared it with his prior exam. He has had further enlargement of the hydrocele which has some wall thickening and the epididymis is no longer distinctly seen. The testicle is unremarkable. See study sheet for details.    Assessment Assessed  1. 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 Epididymitis Left 604.90   He has increased pain with his hydrocele and possible epididymitis and the hydrocele has increased since  last October.   Plan Epididymitis (604.90)  1. Hydrocodone-Acetaminophen 5-325 MG Oral Tablet; 1 - 2 po q 4-6 hours prn pain; Last  Rx:25Apr2013 Health Maintenance (V70.0)  2. UA With REFLEX  Done: 25Apr2013 01:36PM Testicular Hydrocele (603.9)  3. Follow-up Schedule Surgery Office  Follow-up  Done: 25Apr2013   I believe he needs surgical intervention with a left hydrocelectomy and  possible epdidymectomy/orchiectomy.   I reviewed the risks of bleeding, infection, testicular loss or injury with possible progressive hypogonadism, recurrent hydrocele, thrombotic events and anesthetic risks. He will continue his doxycycline and I have refilled the hydrocodone.

## 2011-10-19 ENCOUNTER — Encounter (HOSPITAL_BASED_OUTPATIENT_CLINIC_OR_DEPARTMENT_OTHER)
Admission: RE | Disposition: A | Discharge status: Home / Self Care | Payer: Self-pay | Source: Ambulatory Visit | Attending: Urology

## 2011-10-19 ENCOUNTER — Encounter (HOSPITAL_BASED_OUTPATIENT_CLINIC_OR_DEPARTMENT_OTHER): Payer: Self-pay | Admitting: *Deleted

## 2011-10-19 ENCOUNTER — Ambulatory Visit (HOSPITAL_BASED_OUTPATIENT_CLINIC_OR_DEPARTMENT_OTHER)
Admission: RE | Admit: 2011-10-19 | Discharge: 2011-10-19 | Disposition: A | Discharge status: Home / Self Care | Payer: 59 | Source: Ambulatory Visit | Attending: Urology | Admitting: Urology

## 2011-10-19 ENCOUNTER — Encounter (HOSPITAL_BASED_OUTPATIENT_CLINIC_OR_DEPARTMENT_OTHER): Payer: Self-pay | Admitting: Anesthesiology

## 2011-10-19 ENCOUNTER — Ambulatory Visit (HOSPITAL_BASED_OUTPATIENT_CLINIC_OR_DEPARTMENT_OTHER): Payer: 59 | Admitting: Anesthesiology

## 2011-10-19 DIAGNOSIS — N453 Epididymo-orchitis: Secondary | ICD-10-CM | POA: Insufficient documentation

## 2011-10-19 DIAGNOSIS — N432 Other hydrocele: Secondary | ICD-10-CM | POA: Insufficient documentation

## 2011-10-19 HISTORY — PX: HYDROCELE EXCISION: SHX482

## 2011-10-19 HISTORY — DX: Hydrocele, unspecified: N43.3

## 2011-10-19 HISTORY — DX: Scoliosis, unspecified: M41.9

## 2011-10-19 HISTORY — DX: Personal history of urinary calculi: Z87.442

## 2011-10-19 SURGERY — HYDROCELECTOMY
Anesthesia: General | Site: Scrotum | Laterality: Left | Wound class: Clean

## 2011-10-19 MED ORDER — ACETAMINOPHEN 650 MG RE SUPP: 650.0000 mg | RECTAL | Status: DC | PRN

## 2011-10-19 MED ORDER — BUPIVACAINE HCL (PF) 0.25 % IJ SOLN
INTRAMUSCULAR | Status: DC | PRN
  Administered 2011-10-19: 13 mL

## 2011-10-19 MED ORDER — FENTANYL CITRATE 0.05 MG/ML IJ SOLN
INTRAMUSCULAR | Status: DC | PRN
  Administered 2011-10-19 (×4): 50 ug via INTRAVENOUS

## 2011-10-19 MED ORDER — DEXAMETHASONE SODIUM PHOSPHATE 4 MG/ML IJ SOLN
INTRAMUSCULAR | Status: DC | PRN
  Administered 2011-10-19: 10 mg via INTRAVENOUS

## 2011-10-19 MED ORDER — SODIUM CHLORIDE 0.9 % IJ SOLN: 3.0000 mL | INTRAMUSCULAR | Status: DC | PRN

## 2011-10-19 MED ORDER — LACTATED RINGERS IV SOLN
INTRAVENOUS | Status: DC
  Administered 2011-10-19: 11:00:00 via INTRAVENOUS

## 2011-10-19 MED ORDER — ACETAMINOPHEN 10 MG/ML IV SOLN
INTRAVENOUS | Status: DC | PRN
  Administered 2011-10-19: 1000 mg via INTRAVENOUS

## 2011-10-19 MED ORDER — ONDANSETRON HCL 4 MG/2ML IJ SOLN: 4.0000 mg | Freq: Four times a day (QID) | INTRAMUSCULAR | Status: DC | PRN

## 2011-10-19 MED ORDER — ACETAMINOPHEN 325 MG PO TABS: 650.0000 mg | ORAL_TABLET | ORAL | Status: DC | PRN

## 2011-10-19 MED ORDER — SODIUM CHLORIDE 0.9 % IJ SOLN: 3.0000 mL | Freq: Two times a day (BID) | INTRAMUSCULAR | Status: DC

## 2011-10-19 MED ORDER — FENTANYL CITRATE 0.05 MG/ML IJ SOLN: 25.0000 ug | INTRAMUSCULAR | Status: DC | PRN

## 2011-10-19 MED ORDER — FENTANYL CITRATE 0.05 MG/ML IJ SOLN
25.0000 ug | INTRAMUSCULAR | Status: DC | PRN
  Administered 2011-10-19: 25 ug via INTRAVENOUS

## 2011-10-19 MED ORDER — HYDROCODONE-ACETAMINOPHEN 5-325 MG PO TABS: 1.0000 | ORAL_TABLET | Freq: Four times a day (QID) | ORAL | Status: DC | PRN

## 2011-10-19 MED ORDER — CEFAZOLIN SODIUM 1-5 GM-% IV SOLN
1.0000 g | INTRAVENOUS | Status: AC
  Administered 2011-10-19: 2 g via INTRAVENOUS

## 2011-10-19 MED ORDER — PROPOFOL 10 MG/ML IV EMUL
INTRAVENOUS | Status: DC | PRN
  Administered 2011-10-19: 200 mg via INTRAVENOUS

## 2011-10-19 MED ORDER — SODIUM CHLORIDE 0.9 % IV SOLN: 250.0000 mL | INTRAVENOUS | Status: DC | PRN

## 2011-10-19 MED ORDER — MIDAZOLAM HCL 5 MG/5ML IJ SOLN
INTRAMUSCULAR | Status: DC | PRN
  Administered 2011-10-19: 2 mg via INTRAVENOUS

## 2011-10-19 MED ORDER — OXYCODONE HCL 5 MG PO TABS
5.0000 mg | ORAL_TABLET | ORAL | Status: DC | PRN
  Administered 2011-10-19: 5 mg via ORAL

## 2011-10-19 MED ORDER — LIDOCAINE HCL (CARDIAC) 20 MG/ML IV SOLN
INTRAVENOUS | Status: DC | PRN
  Administered 2011-10-19: 80 mg via INTRAVENOUS

## 2011-10-19 MED ORDER — ONDANSETRON HCL 4 MG/2ML IJ SOLN
INTRAMUSCULAR | Status: DC | PRN
  Administered 2011-10-19: 4 mg via INTRAVENOUS

## 2011-10-19 MED ORDER — PROMETHAZINE HCL 25 MG/ML IJ SOLN: 6.2500 mg | INTRAMUSCULAR | Status: DC | PRN

## 2011-10-19 SURGICAL SUPPLY — 55 items
APPLICATOR COTTON TIP 6IN STRL (MISCELLANEOUS) IMPLANT
BANDAGE GAUZE ELAST BULKY 4 IN (GAUZE/BANDAGES/DRESSINGS) ×3 IMPLANT
BENZOIN TINCTURE PRP APPL 2/3 (GAUZE/BANDAGES/DRESSINGS) ×3 IMPLANT
BLADE SURG 15 STRL LF DISP TIS (BLADE) ×2 IMPLANT
BLADE SURG 15 STRL SS (BLADE) ×1
BLADE SURG ROTATE 9660 (MISCELLANEOUS) ×3 IMPLANT
CANISTER SUCTION 1200CC (MISCELLANEOUS) ×3 IMPLANT
CANISTER SUCTION 2500CC (MISCELLANEOUS) IMPLANT
CLEANER CAUTERY TIP 5X5 PAD (MISCELLANEOUS) ×2 IMPLANT
CLOTH BEACON ORANGE TIMEOUT ST (SAFETY) ×3 IMPLANT
COVER MAYO STAND STRL (DRAPES) ×3 IMPLANT
COVER TABLE BACK 60X90 (DRAPES) ×3 IMPLANT
DISSECTOR ROUND CHERRY 3/8 STR (MISCELLANEOUS) IMPLANT
DRAIN PENROSE 18X1/4 LTX STRL (WOUND CARE) ×3 IMPLANT
DRAPE LAPAROTOMY TRNSV 102X78 (DRAPE) IMPLANT
DRAPE PED LAPAROTOMY (DRAPES) ×3 IMPLANT
DRSG TEGADERM 4X4.75 (GAUZE/BANDAGES/DRESSINGS) ×3 IMPLANT
ELECT NEEDLE TIP 2.8 STRL (NEEDLE) IMPLANT
ELECT REM PT RETURN 9FT ADLT (ELECTROSURGICAL) ×3
ELECTRODE REM PT RTRN 9FT ADLT (ELECTROSURGICAL) ×2 IMPLANT
GAUZE SPONGE 4X4 12PLY STRL LF (GAUZE/BANDAGES/DRESSINGS) ×3 IMPLANT
GLOVE BIOGEL PI IND STRL 6.5 (GLOVE) ×2 IMPLANT
GLOVE BIOGEL PI INDICATOR 6.5 (GLOVE) ×1
GLOVE ECLIPSE 6.0 STRL STRAW (GLOVE) ×3 IMPLANT
GLOVE SURG SS PI 8.0 STRL IVOR (GLOVE) ×3 IMPLANT
GOWN SURGICAL LARGE (GOWNS) ×3 IMPLANT
GOWN W/COTTON TOWEL STD LRG (GOWNS) ×3 IMPLANT
GOWN XL W/COTTON TOWEL STD (GOWNS) ×3 IMPLANT
NEEDLE HYPO 22GX1.5 SAFETY (NEEDLE) ×3 IMPLANT
NEEDLE HYPO 25X1 1.5 SAFETY (NEEDLE) ×3 IMPLANT
NS IRRIG 500ML POUR BTL (IV SOLUTION) ×3 IMPLANT
PACK BASIN DAY SURGERY FS (CUSTOM PROCEDURE TRAY) ×3 IMPLANT
PAD CLEANER CAUTERY TIP 5X5 (MISCELLANEOUS) ×1
PENCIL BUTTON HOLSTER BLD 10FT (ELECTRODE) ×3 IMPLANT
STRIP CLOSURE SKIN 1/2X4 (GAUZE/BANDAGES/DRESSINGS) IMPLANT
SUPPORT SCROTAL LG STRP (MISCELLANEOUS) ×3 IMPLANT
SUT CHROMIC 3 0 SH 27 (SUTURE) ×9 IMPLANT
SUT CHROMIC GUT AB #0 18 (SUTURE) IMPLANT
SUT PROLENE 2 0 CT2 30 (SUTURE) IMPLANT
SUT SILK 3 0 TIES 17X18 (SUTURE)
SUT SILK 3-0 18XBRD TIE BLK (SUTURE) IMPLANT
SUT VIC AB 3-0 SH 27 (SUTURE) ×1
SUT VIC AB 3-0 SH 27X BRD (SUTURE) ×2 IMPLANT
SUT VIC AB 4-0 P-3 18XBRD (SUTURE) ×2 IMPLANT
SUT VIC AB 4-0 P3 18 (SUTURE) ×1
SUT VICRYL 0 TIES 12 18 (SUTURE) IMPLANT
SUT VICRYL 4-0 PS2 18IN ABS (SUTURE) IMPLANT
SYR BULB IRRIGATION 50ML (SYRINGE) ×3 IMPLANT
SYR CONTROL 10ML LL (SYRINGE) ×3 IMPLANT
TOWEL OR 17X24 6PK STRL BLUE (TOWEL DISPOSABLE) ×6 IMPLANT
TRAY DSU PREP LF (CUSTOM PROCEDURE TRAY) ×3 IMPLANT
TUBE CONNECTING 12X1/4 (SUCTIONS) ×3 IMPLANT
VICRYL ×3 IMPLANT
WATER STERILE IRR 500ML POUR (IV SOLUTION) IMPLANT
YANKAUER SUCT BULB TIP NO VENT (SUCTIONS) ×3 IMPLANT

## 2011-10-19 NOTE — Brief Op Note (Signed)
10/19/2011  1:17 PM  PATIENT:  Nicholas Moody  56 y.o. male  PRE-OPERATIVE DIAGNOSIS:  left hydrocele  POST-OPERATIVE DIAGNOSIS:  left hydrocele and epididymitis  PROCEDURE:  Procedure(s) (LRB): HYDROCELECTOMY ADULT AND LEFT EPIDIDYMECTOMY(Left)  SURGEON:  Surgeon(s) and Role:    * Anner Crete, MD - Primary  PHYSICIAN ASSISTANT:   ASSISTANTS: none   ANESTHESIA:   general  EBL:  Total I/O In: 100 [I.V.:100] Out: -   BLOOD ADMINISTERED:none  DRAINS: Penrose drain in the left scrotum.   LOCAL MEDICATIONS USED:  MARCAINE     SPECIMEN:  Source of Specimen:  left epididymis and hydrocele sac.  DISPOSITION OF SPECIMEN:  PATHOLOGY  COUNTS:  YES  TOURNIQUET:  * No tourniquets in log *  DICTATION: .Other Dictation: Dictation Number [id number]  PLAN OF CARE: Discharge to home after PACU  PATIENT DISPOSITION:  PACU - hemodynamically stable.   Delay start of Pharmacological VTE agent (>24hrs) due to surgical blood loss or risk of bleeding: yes

## 2011-10-19 NOTE — Discharge Instructions (Addendum)
Hydrocele, Adult Fluid can collect around the testicles. This fluid forms in a sac. This condition is called a hydrocele. The collected fluid causes swelling of the scrotum. Usually, it affects just one testicle. Most of the time, the condition does not cause pain. Sometimes, the hydrocele goes away on its own. Other times, surgery is needed to get rid of the fluid. CAUSES A hydrocele does not develop often. Different things can cause a hydrocele in a man, including:  Injury to the scrotum.   Infection.   X-ray of the area around the scrotum.   A tumor or cancer of the testicle.   Twisting of a testicle.   Decreased blood flow to the scrotum.  SYMPTOMS   Swelling without pain. The hydrocele feels like a water-filled balloon.   Swelling with pain. This can occur if the hydrocele was caused by infection or twisting.   Mild discomfort in the scrotum.   The hydrocele may feel heavy.   Swelling that gets smaller when you lie down.  DIAGNOSIS  Your caregiver will do a physical exam to decide if you have a hydrocele. This may include:  Asking questions about your overall health, today and in the past. Your caregiver may ask about any injuries, X-rays, or infections.   Pushing on your abdomen or asking you to change positions to see if the size of the hydrocele changes.   Shining a light through the scrotum (transillumination) to see if the fluid inside the scrotum is clear.   Blood tests and urine tests to check for infection.   Imaging studies that take pictures of the scrotum and testicles.  TREATMENT  Treatment depends in part on what caused the condition. Options include:  Watchful waiting. Your caregiver checks the hydrocele every so often.   Different surgeries to drain the fluid.   A needle may be put into the scrotum to drain fluid (needle aspiration). Fluid often returns after this type of treatment.   A cut (incision) may be made in the scrotum to remove the fluid  sac (hydrocelectomy).   An incision may be made in the groin to repair a hydrocele that has contact with abdominal fluids (communicating hydrocele).   Medicines to treat an infection (antibiotics).  HOME CARE INSTRUCTIONS  What you need to do at home may depend on the cause of the hydrocele and type of treatment. In general:  Take all medicine as directed by your caregiver. Follow the directions carefully.   Ask your caregiver if there is anything you should not do while you recover (activities, lifting, work, sex).   If you had surgery to repair a communicating hydrocele, recovery time may vary. Ask you caregiver about your recovery time.   Avoid heavy lifting for 4 to 6 weeks.   If you had an incision on the scrotum or groin, wash it for 2 to 3 days after surgery. Do this as long as the skin is closed and there are no gaps in the wound. Wash gently, and avoid rubbing the incision.   Keep all follow-up appointments.  SEEK MEDICAL CARE IF:   Your scrotum seems to be getting larger.   The area becomes more and more uncomfortable.  SEEK IMMEDIATE MEDICAL CARE IF:  You have a fever. Document Released: 11/23/2009 Document Revised: 05/25/2011 Document Reviewed: 11/23/2009 [facility] Patient Information 2012 [place], [place].  You should remove the wound drain in the morning.  It should just slide out, but if it doesn't come easily.   Let  us know and we will have you come to the office for removal Post Anesthesia Home Care Instructions  Activity: Get plenty of rest for the remainder of the day. A responsible adult should stay with you for 24 hours following the procedure.  For the next 24 hours, DO NOT: -Drive a car -Advertising copywriter -Drink alcoholic beverages -Take any medication unless instructed by your physician -Make any legal decisions or sign important papers.  Meals: Start with liquid foods such as gelatin or soup. Progress to regular foods as tolerated. Avoid greasy, spicy,  heavy foods. If nausea and/or vomiting occur, drink only clear liquids until the nausea and/or vomiting subsides. Call your physician if vomiting continues.  Special Instructions/Symptoms: Your throat may feel dry or sore from the anesthesia or the breathing tube placed in your throat during surgery. If this causes discomfort, gargle with warm salt water. The discomfort should disappear within 24 hours.  Marland Kitchen

## 2011-10-19 NOTE — Anesthesia Procedure Notes (Signed)
Procedure Name: LMA Insertion Date/Time: 10/19/2011 12:24 PM Performed by: Maris Berger T Pre-anesthesia Checklist: Patient identified, Emergency Drugs available, Suction available and Patient being monitored Patient Re-evaluated:Patient Re-evaluated prior to inductionOxygen Delivery Method: Circle System Utilized Preoxygenation: Pre-oxygenation with 100% oxygen Intubation Type: IV induction Ventilation: Mask ventilation without difficulty LMA: LMA inserted LMA Size: 5.0 Number of attempts: 1 Airway Equipment and Method: bite block Placement Confirmation: positive ETCO2 Dental Injury: Teeth and Oropharynx as per pre-operative assessment

## 2011-10-19 NOTE — Interval H&P Note (Signed)
History and Physical Interval Note:  10/19/2011 7:25 AM  Nicholas Moody  has presented today for surgery, with the diagnosis of left hydrocele  The various methods of treatment have been discussed with the patient and family. After consideration of risks, benefits and other options for treatment, the patient has consented to  Procedure(s) (LRB): HYDROCELECTOMY ADULT (Left) ORCHIECTOMY (Left) as a surgical intervention .  The patients' history has been reviewed, patient examined, no change in status, stable for surgery.  I have reviewed the patients' chart and labs.  Questions were answered to the patient's satisfaction.     Jeralynn Vaquera J

## 2011-10-19 NOTE — Anesthesia Preprocedure Evaluation (Addendum)
Anesthesia Evaluation  Patient identified by MRN, date of birth, ID band Patient awake    Reviewed: Allergy & Precautions, H&P , NPO status , Patient's Chart, lab work & pertinent test results  Airway Mallampati: III TM Distance: <3 FB Neck ROM: Full    Dental No notable dental hx.    Pulmonary neg pulmonary ROS,  breath sounds clear to auscultation  Pulmonary exam normal       Cardiovascular negative cardio ROS  Rhythm:Regular Rate:Normal     Neuro/Psych negative neurological ROS  negative psych ROS   GI/Hepatic negative GI ROS, Neg liver ROS,   Endo/Other  negative endocrine ROS  Renal/GU negative Renal ROS  negative genitourinary   Musculoskeletal negative musculoskeletal ROS (+)   Abdominal   Peds negative pediatric ROS (+)  Hematology negative hematology ROS (+)   Anesthesia Other Findings   Reproductive/Obstetrics negative OB ROS                         Anesthesia Physical Anesthesia Plan  ASA: II  Anesthesia Plan: General   Post-op Pain Management:    Induction: Intravenous  Airway Management Planned: LMA  Additional Equipment:   Intra-op Plan:   Post-operative Plan: Extubation in OR  Informed Consent: I have reviewed the patients History and Physical, chart, labs and discussed the procedure including the risks, benefits and alternatives for the proposed anesthesia with the patient or authorized representative who has indicated his/her understanding and acceptance.   Dental advisory given  Plan Discussed with: CRNA  Anesthesia Plan Comments:         Anesthesia Quick Evaluation

## 2011-10-19 NOTE — Transfer of Care (Signed)
Immediate Anesthesia Transfer of Care Note  Patient: Nicholas Moody  Procedure(s) Performed: Procedure(s) (LRB): HYDROCELECTOMY ADULT (Left)  Patient Location: PACU  Anesthesia Type: General  Level of Consciousness: awake and oriented  Airway & Oxygen Therapy: Patient Spontanous Breathing and Patient connected to nasal cannula oxygen  Post-op Assessment: Report given to PACU RN  Post vital signs: Reviewed and stable  Complications: No apparent anesthesia complications

## 2011-10-20 ENCOUNTER — Encounter (HOSPITAL_BASED_OUTPATIENT_CLINIC_OR_DEPARTMENT_OTHER): Payer: Self-pay | Admitting: Urology

## 2011-10-20 NOTE — Anesthesia Postprocedure Evaluation (Signed)
  Anesthesia Post-op Note  Patient: Nicholas Moody  Procedure(s) Performed: Procedure(s) (LRB): HYDROCELECTOMY ADULT (Left)  Patient Location: PACU  Anesthesia Type: General  Level of Consciousness: awake and alert   Airway and Oxygen Therapy: Patient Spontanous Breathing  Post-op Pain: mild  Post-op Assessment: Post-op Vital signs reviewed, Patient's Cardiovascular Status Stable, Respiratory Function Stable, Patent Airway and No signs of Nausea or vomiting  Post-op Vital Signs: stable  Complications: No apparent anesthesia complications

## 2011-10-20 NOTE — Op Note (Signed)
NAMEMarland Kitchen  RAMELO, OETKEN NO.:  [id number]  MEDICAL RECORD NO.:  [id number]  LOCATION:                                 FACILITY:  PHYSICIAN:  Excell Seltzer. Annabell Howells, M.D.    DATE OF BIRTH:  07/08/1955  DATE OF PROCEDURE:  10/19/2011 DATE OF DISCHARGE:                              OPERATIVE REPORT   The patient of Dr. Bjorn Pippin.  PROCEDURE:  Left hydrocelectomy and left epididymectomy.  PREOPERATIVE DIAGNOSIS:  Left hydrocele.  POSTOPERATIVE DIAGNOSIS:  Left hydrocele with left epididymitis.  SURGEON:  Excell Seltzer. Annabell Howells, M.D.  ANESTHESIA:  General.  SPECIMENS:  Left epididymis and hydrocele sac.  DRAINS:  Quarter-inch Penrose.  ESTIMATED BLOOD LOSS:  Minimal.  COMPLICATIONS:  None.  INDICATIONS:  [name] is a 56 year old white male who has had progressive left scrotal swelling and pain.  It was felt to have epididymitis and was placed on antibiotics, but the hydrocele increased in size on recent ultrasound.  There appeared to be some inflammatory changes of the epididymis and testicle.  It was felt that left hydrocelectomy with possible epididymectomy and possible orchiectomy was indicated.  FINDINGS OF PROCEDURE:  He was given Ancef.  He was taken to the operating room where general anesthetic was induced.  He was fitted with PAS hose and placed in the supine position.  His genitalia was clipped. He was prepped with Betadine solution and draped in usual sterile fashion.  An oblique incision was made on the left anterior scrotum along skin lines, and the dartos was incised with the Bovie.  The testicle within the hydrocele sac was delivered from the scrotum and the hydrocele sac was opened and drained.  There was approximately 150 cc in the hydrocele sac.  Once the sac was opened and drained, the testicle was exposed. Inspection revealed a normal-appearing testicle; however, the epididymis was quite inflamed with some cystic changes and it was felt  an epididymectomy was indicated.  The epididymis was then removed using combination of cautery and sharp dissection.  The testis was doubly ligated with 2-0 Vicryl ties as were as the epididymal blood supply.  The vas was divided after ligation with a double ligation with 2-0 Vicryl ties as well.  During the removal of the epididymis, the redundant hydrocele sac was excised along with the epididymis.  I then performed a __________ of residual hydrocele sac behind the testicle, and inspection of the testicle and epididymal bed revealed no active bleeding at this point.  The cord was infiltrated with 5 cc of 0.25% Marcaine without epinephrine.  The testicle was then returned to the left hemiscrotum and a quarter-inch Penrose drain was placed through separate stab wound in the dependent portion of left hemiscrotum.  The drain was positioned alongside the testicle.  Inspection revealed no active bleeding except skin edges.  The wound was then closed in 2 layers using a running 3-0 chromic on the dartos muscle and great care was being taken to avoid entrapment of the drain, and the skin was closed with interrupted vertical mattress of 3-0 chromic.  An additional 8 cc of 0.25% Marcaine was infiltrated into the incision.  At this point,  the drain was trimmed, but left unsecured.  A dressing with 4 x 4 's , fluff, Kerlix, and __________ support was applied.  The patient's anesthetic was reversed.  He was moved to recovery room in stable condition.  There were no complications.     Excell Seltzer. Annabell Howells, M.D.     JJW/MEDQ  D:  10/19/2011  T:  10/20/2011  Job:  [id number]  cc:   Neena Rhymes, M.D.

## 2014-02-20 ENCOUNTER — Encounter: Payer: Self-pay | Admitting: Gastroenterology

## 2015-04-05 ENCOUNTER — Encounter: Payer: Self-pay | Admitting: Gastroenterology

## 2020-04-02 ENCOUNTER — Other Ambulatory Visit: Payer: Self-pay

## 2020-04-02 ENCOUNTER — Encounter: Payer: Self-pay | Admitting: Family Medicine

## 2020-04-02 ENCOUNTER — Ambulatory Visit (INDEPENDENT_AMBULATORY_CARE_PROVIDER_SITE_OTHER): Payer: Medicare Other | Admitting: Family Medicine

## 2020-04-02 VITALS — BP 118/78 | HR 60 | Temp 97.4°F | Ht 69.5 in | Wt 182.0 lb

## 2020-04-02 DIAGNOSIS — I10 Essential (primary) hypertension: Secondary | ICD-10-CM

## 2020-04-02 DIAGNOSIS — E039 Hypothyroidism, unspecified: Secondary | ICD-10-CM

## 2020-04-02 DIAGNOSIS — R7303 Prediabetes: Secondary | ICD-10-CM

## 2020-04-02 DIAGNOSIS — Z0000 Encounter for general adult medical examination without abnormal findings: Secondary | ICD-10-CM | POA: Diagnosis not present

## 2020-04-02 DIAGNOSIS — Z23 Encounter for immunization: Secondary | ICD-10-CM

## 2020-04-02 LAB — ALT: ALT: 16 U/L (ref 0–53)

## 2020-04-02 LAB — BASIC METABOLIC PANEL
BUN: 11 mg/dL (ref 6–23)
CO2: 27 mEq/L (ref 19–32)
Calcium: 9.4 mg/dL (ref 8.4–10.5)
Chloride: 102 mEq/L (ref 96–112)
Creatinine, Ser: 0.95 mg/dL (ref 0.40–1.50)
GFR: 83.19 mL/min (ref >60.00)
Glucose, Bld: 86 mg/dL (ref 70–99)
Potassium: 4.1 mEq/L (ref 3.5–5.1)
Sodium: 138 mEq/L (ref 135–145)

## 2020-04-02 LAB — LIPID PANEL
Cholesterol: 145 mg/dL (ref 0–200)
HDL: 46.4 mg/dL (ref >39.00)
LDL Cholesterol: 73 mg/dL (ref 0–99)
NonHDL: 98.78
Total CHOL/HDL Ratio: 3
Triglycerides: 128 mg/dL (ref 0.0–149.0)
VLDL: 25.6 mg/dL (ref 0.0–40.0)

## 2020-04-02 LAB — HEMOGLOBIN A1C: Hgb A1c MFr Bld: 5.5 % (ref 4.6–6.5)

## 2020-04-02 LAB — CBC
HCT: 38 % — ABNORMAL LOW (ref 39.0–52.0)
Hemoglobin: 13.5 g/dL (ref 13.0–17.0)
MCHC: 35.5 g/dL (ref 30.0–36.0)
MCV: 93.8 fl (ref 78.0–100.0)
Platelets: 273 10*3/uL (ref 150.0–400.0)
RBC: 4.05 Mil/uL — ABNORMAL LOW (ref 4.22–5.81)
RDW: 13 % (ref 11.5–15.5)
WBC: 8.8 10*3/uL (ref 4.0–10.5)

## 2020-04-02 LAB — AST: AST: 23 U/L (ref 0–37)

## 2020-04-02 LAB — T4, FREE: Free T4: 0.78 ng/dL (ref 0.60–1.60)

## 2020-04-02 LAB — TSH: TSH: 1.58 u[IU]/mL (ref 0.35–4.50)

## 2020-04-02 NOTE — Progress Notes (Signed)
Nicholas Moody is a 64 y.o. male  Chief Complaint  Patient presents with  . Establish Care    NP- CPE/labs. fasting this am. no concerns.  wants flu shot today.    HPI: Nicholas Moody is a 64 y.o. male who is seen today to establish care with our office and for annual CPE, fasting labs. He would like a flu shot today.  Previous PCP at [facility].   Last Colonoscopy: 4-5 years ago - was done at [facility] in [facility]  Dental: UTD Vision: pt had glasses, overdue for eye exam Derm: pt has a h/o skin cancer, Mohs surgery. He has annual appt at [facility].   Med refills needed today: no  Past Medical History:  Diagnosis Date  . Allergy   . Anxiety   . Cancer ([place])    skin cancer x 2  . History of renal stone 2011   lithotripsy  . Hydrocele of testis   . Hypertension   . Scoliosis    sees chiropractor monthly  . Scoliosis   . Thyroid disease     Past Surgical History:  Procedure Laterality Date  . HYDROCELE EXCISION  10/19/2011   Procedure: HYDROCELECTOMY ADULT;  Surgeon: Malka So, MD;  Location: [facility];  Service: Urology;  Laterality: Left;  left hydrocelectomy , left epididectomy   . LITHOTRIPSY     x2  . MOHS SURGERY    . VASECTOMY      Social History   Socioeconomic History  . Marital status: Married    Spouse name: Not on file  . Number of children: Not on file  . Years of education: Not on file  . Highest education level: Not on file  Occupational History  . Not on file  Tobacco Use  . Smoking status: Never Smoker  . Smokeless tobacco: Never Used  Vaping Use  . Vaping Use: Never used  Substance and Sexual Activity  . Alcohol use: Not Currently    Comment: 2/week  . Drug use: Yes    Types: Marijuana  . Sexual activity: Yes  Other Topics Concern  . Not on file  Social History Narrative  . Not on file   Social Determinants of Health   Financial Resource Strain:   . Difficulty of Paying Living Expenses:  Not on file  Food Insecurity:   . Worried About Charity fundraiser in the Last Year: Not on file  . Ran Out of Food in the Last Year: Not on file  Transportation Needs:   . Lack of Transportation (Medical): Not on file  . Lack of Transportation (Non-Medical): Not on file  Physical Activity:   . Days of Exercise per Week: Not on file  . Minutes of Exercise per Session: Not on file  Stress:   . Feeling of Stress : Not on file  Social Connections:   . Frequency of Communication with Friends and Family: Not on file  . Frequency of Social Gatherings with Friends and Family: Not on file  . Attends Religious Services: Not on file  . Active Member of Clubs or Organizations: Not on file  . Attends Archivist Meetings: Not on file  . Marital Status: Not on file  Intimate Partner Violence:   . Fear of Current or Ex-Partner: Not on file  . Emotionally Abused: Not on file  . Physically Abused: Not on file  . Sexually Abused: Not on file    Family History  Problem Relation Age of Onset  . Hypertension Mother   . Heart disease Mother   . Cancer Mother        bone  . Hypertension Father   . Cancer Father        bone and colon  . Kidney failure Father      Immunization History  Administered Date(s) Administered  . Influenza Whole 03/21/2010  . PFIZER SARS-COV-2 Vaccination 08/18/2019, 09/12/2019  . Td 03/21/2010    Outpatient Encounter Medications as of 04/02/2020  Medication Sig  . levothyroxine (SYNTHROID) 125 MCG tablet Take 125 mcg by mouth daily.  Marland Kitchen lisinopril (ZESTRIL) 20 MG tablet Take 20 mg by mouth daily.  Marland Kitchen LORazepam (ATIVAN) 0.5 MG tablet   . sildenafil (VIAGRA) 100 MG tablet TAKE ONE TABLET BY MOUTH AS DIRECTED FOR 90 DAYS  . doxycycline (VIBRAMYCIN) 100 MG capsule Take 100 mg by mouth.  Marland Kitchen HYDROcodone-acetaminophen (NORCO) 5-325 MG per tablet Take 1 tablet by mouth every 6 (six) hours as needed.   No facility-administered encounter medications on file as of  04/02/2020.     ROS: Gen: no fever, chills  Skin: no rash, itching ENT: no ear pain, ear drainage, nasal congestion, rhinorrhea, sinus pressure, sore throat Eyes: no blurry vision, double vision Resp: no cough, wheeze,SOB CV: no CP, palpitations, LE edema,  GI: no heartburn, n/v/d/c, abd pain GU: no dysuria, urgency, frequency, hematuria; no testicular swelling or masses MSK: no joint pain, myalgias, back pain Neuro: no dizziness, headache, weakness Psych: no depression, occasional anxiety, no insomnia   No Known Allergies  BP 118/78   Pulse 60   Temp (!) 97.4 F (36.3 C) (Temporal)   Ht 5' 9.5" (1.765 m)   Wt 182 lb (82.6 kg)   SpO2 97%   BMI 26.49 kg/m   Physical Exam Constitutional:      General: He is not in acute distress.    Appearance: He is well-developed.  HENT:     Head: Normocephalic and atraumatic.     Right Ear: Tympanic membrane and ear canal normal.     Left Ear: Tympanic membrane and ear canal normal.     Nose: Nose normal.  Neck:     Thyroid: No thyromegaly.  Cardiovascular:     Rate and Rhythm: Normal rate and regular rhythm.     Heart sounds: Normal heart sounds.  Pulmonary:     Effort: Pulmonary effort is normal.     Breath sounds: Normal breath sounds. No wheezing or rhonchi.  Abdominal:     General: Bowel sounds are normal. There is no distension.     Palpations: Abdomen is soft. There is no mass.     Tenderness: There is no abdominal tenderness. There is no guarding or rebound.  Musculoskeletal:        General: Normal range of motion.     Cervical back: Neck supple.     Right lower leg: No edema.     Left lower leg: No edema.  Lymphadenopathy:     Cervical: No cervical adenopathy.  Skin:    General: Skin is warm and dry.  Neurological:     Mental Status: He is alert and oriented to person, place, and time.     Motor: No abnormal muscle tone.     Coordination: Coordination normal.      A/P:  1. Annual physical exam -  discussed importance of regular CV exercise, healthy diet, adequate sleep - colonoscopy UTD - flu vaccine today - dental  UTD, needs vision exam - ALT - AST - Basic metabolic panel - Lipid panel - CBC - next CPE in 1 year  2. Primary hypertension - controlled, at goal - cont lisinopril 20mg  daily - Basic metabolic panel - CBC  3. Hypothyroidism, unspecified type - stable on levothyroxine 111mcg 6 days per week - TSH - T4, free  4. Prediabetes - pt states this was prior to his retirement and since that time he has lost weight and is much less stress - Hemoglobin A1c  This visit occurred during the SARS-CoV-2 public health emergency.  Safety protocols were in place, including screening questions prior to the visit, additional usage of staff PPE, and extensive cleaning of exam room while observing appropriate contact time as indicated for disinfecting solutions.

## 2020-04-02 NOTE — Patient Instructions (Signed)

## 2020-04-07 ENCOUNTER — Encounter: Payer: Self-pay | Admitting: Family Medicine

## 2020-06-09 ENCOUNTER — Telehealth: Payer: Self-pay | Admitting: Family Medicine

## 2020-06-09 NOTE — Telephone Encounter (Signed)
Attempted to schedule AWV. Unable to LVM.  Will try at later time.   Called patient to schedule Annual Wellness Visit.  Please schedule with Nurse Health Advisor Martha Stanley, RN at [facility]  

## 2020-08-16 ENCOUNTER — Encounter: Payer: Self-pay | Admitting: Family Medicine

## 2020-08-16 NOTE — Telephone Encounter (Signed)
I spoke with pt and he informed that her does have enough medication to hold him over until his appointment on 08/25/20 at 3pm.  Last OV 04/02/20 Last fill 02/20/20 Historical Provider

## 2020-08-24 ENCOUNTER — Other Ambulatory Visit: Payer: Self-pay

## 2020-08-25 ENCOUNTER — Encounter: Payer: Self-pay | Admitting: Family Medicine

## 2020-08-25 ENCOUNTER — Ambulatory Visit (INDEPENDENT_AMBULATORY_CARE_PROVIDER_SITE_OTHER): Payer: Medicare Other | Admitting: Family Medicine

## 2020-08-25 VITALS — BP 148/86 | HR 70 | Temp 97.2°F | Wt 195.4 lb

## 2020-08-25 DIAGNOSIS — I10 Essential (primary) hypertension: Secondary | ICD-10-CM | POA: Diagnosis not present

## 2020-08-25 MED ORDER — LISINOPRIL 20 MG PO TABS: 20.0000 mg | ORAL_TABLET | Freq: Every day | ORAL | 3 refills | Status: DC

## 2020-08-25 NOTE — Progress Notes (Signed)
Chief Complaint  Patient presents with  . Medication Refill    Refill on Lisinopril, patient would also like to know if he should get second covid booster.    HPI: Nicholas Moody is a 65 y.o. male here for HTN follow-up and med refill. Pt is taking lisinopril 20mg  daily. He has not missed a dose.  He had a Hospital doctor energy drink about 2hrs ago. Pt also smokes marijuana.  He does not have a home BP cuff.   BP Readings from Last 3 Encounters:  08/25/20 (!) 148/86  04/02/20 118/78  10/19/11 (!) 166/105   Lab Results  Component Value Date   CREATININE 0.95 04/02/2020   BUN 11 04/02/2020   NA 138 04/02/2020   K 4.1 04/02/2020   CL 102 04/02/2020   CO2 27 04/02/2020     Past Medical History:  Diagnosis Date  . Allergy   . Anxiety   . Cancer ([place])    skin cancer x 2  . History of renal stone 2011   lithotripsy  . Hydrocele of testis   . Hypertension   . Scoliosis    sees chiropractor monthly  . Scoliosis   . Thyroid disease     Past Surgical History:  Procedure Laterality Date  . HYDROCELE EXCISION  10/19/2011   Procedure: HYDROCELECTOMY ADULT;  Surgeon: Malka So, MD;  Location[facility];  Service: Urology;  Laterality: Left;  left hydrocelectomy , left epididectomy   . LITHOTRIPSY     x2  . MOHS SURGERY    . VASECTOMY      Social History   Socioeconomic History  . Marital status: Married    Spouse name: Not on file  . Number of children: Not on file  . Years of education: Not on file  . Highest education level: Not on file  Occupational History  . Not on file  Tobacco Use  . Smoking status: Never Smoker  . Smokeless tobacco: Never Used  Vaping Use  . Vaping Use: Never used  Substance and Sexual Activity  . Alcohol use: Not Currently    Comment: 2/week  . Drug use: Yes    Types: Marijuana  . Sexual activity: Yes  Other Topics Concern  . Not on file  Social History Narrative  . Not on file   Social Determinants of Health    Financial Resource Strain: Not on file  Food Insecurity: Not on file  Transportation Needs: Not on file  Physical Activity: Not on file  Stress: Not on file  Social Connections: Not on file  Intimate Partner Violence: Not on file    Family History  Problem Relation Age of Onset  . Hypertension Mother   . Heart disease Mother   . Cancer Mother        bone  . Hypertension Father   . Cancer Father        bone and colon  . Kidney failure Father      Immunization History  Administered Date(s) Administered  . Fluad Quad(high Dose 65+) 04/02/2020  . Influenza Whole 03/21/2010  . PFIZER(Purple Top)SARS-COV-2 Vaccination 08/18/2019, 09/12/2019, 03/16/2020  . Td 03/21/2010  . Zoster Recombinat (Shingrix) 07/28/2020    Outpatient Encounter Medications as of 08/25/2020  Medication Sig  . levothyroxine (SYNTHROID) 125 MCG tablet Take 125 mcg by mouth daily.  . sildenafil (VIAGRA) 100 MG tablet TAKE ONE TABLET BY MOUTH AS DIRECTED FOR 90 DAYS  . [DISCONTINUED] lisinopril (ZESTRIL) 20 MG  tablet Take 20 mg by mouth daily.  Marland Kitchen lisinopril (ZESTRIL) 20 MG tablet Take 1 tablet (20 mg total) by mouth daily.  . [DISCONTINUED] doxycycline (VIBRAMYCIN) 100 MG capsule Take 100 mg by mouth.  . [DISCONTINUED] HYDROcodone-acetaminophen (NORCO) 5-325 MG per tablet Take 1 tablet by mouth every 6 (six) hours as needed.  . [DISCONTINUED] LORazepam (ATIVAN) 0.5 MG tablet  (Patient not taking: Reported on 08/25/2020)  . [DISCONTINUED] sertraline (ZOLOFT) 100 MG tablet Take 100 mg by mouth daily. (Patient not taking: Reported on 08/25/2020)   No facility-administered encounter medications on file as of 08/25/2020.     ROS: Pertinent positives and negatives noted in HPI. Remainder of ROS non-contributory   No Known Allergies  BP (!) 148/86   Pulse 70   Temp (!) 97.2 F (36.2 C) (Temporal)   Wt 195 lb 6.4 oz (88.6 kg)   SpO2 96%   BMI 28.44 kg/m   Wt Readings from Last 3 Encounters:  08/25/20 195  lb 6.4 oz (88.6 kg)  04/02/20 182 lb (82.6 kg)  10/16/11 212 lb (96.2 kg)   Temp Readings from Last 3 Encounters:  08/25/20 (!) 97.2 F (36.2 C) (Temporal)  04/02/20 (!) 97.4 F (36.3 C) (Temporal)  10/19/11 99 F (37.2 C) (Oral)   BP Readings from Last 3 Encounters:  08/25/20 (!) 148/86  04/02/20 118/78  10/19/11 (!) 166/105   Pulse Readings from Last 3 Encounters:  08/25/20 70  04/02/20 60  10/19/11 74     Physical Exam Constitutional:      Appearance: Normal appearance.  Cardiovascular:     Rate and Rhythm: Normal rate and regular rhythm.  Pulmonary:     Effort: Pulmonary effort is normal.     Breath sounds: Normal breath sounds.  Musculoskeletal:     Right lower leg: No edema.     Left lower leg: No edema.  Neurological:     Mental Status: He is alert.  Psychiatric:        Mood and Affect: Mood normal.        Behavior: Behavior normal.      A/P:  1. Primary hypertension - elevated BP today - pt had [employer] energy drink 2 hrs ago and pt has gained 13lbs since last OV in 03/2020 when BP was normal Refill: - lisinopril (ZESTRIL) 20 MG tablet; Take 1 tablet (20 mg total) by mouth daily.  Dispense: 90 tablet; Refill: 3 - f/u in 1-2 wks for BP recheck and adjustment of med if needed    This visit occurred during the SARS-CoV-2 public health emergency.  Safety protocols were in place, including screening questions prior to the visit, additional usage of staff PPE, and extensive cleaning of exam room while observing appropriate contact time as indicated for disinfecting solutions.

## 2020-08-31 ENCOUNTER — Ambulatory Visit: Payer: Medicare Other

## 2020-08-31 ENCOUNTER — Other Ambulatory Visit: Payer: Self-pay

## 2020-08-31 VITALS — BP 138/80 | Ht 69.5 in

## 2020-08-31 DIAGNOSIS — I10 Essential (primary) hypertension: Secondary | ICD-10-CM

## 2020-08-31 NOTE — Patient Instructions (Signed)
Health Maintenance Due  Topic Date Due  . Hepatitis C Screening  Never done  . PNA vac Low Risk Adult (1 of 2 - PCV13) Never done  . TETANUS/TDAP  03/21/2020  . COLONOSCOPY (Pts 45-77yrs Insurance coverage will need to be confirmed)  05/04/2020    Depression screen [facility] 2/9 08/25/2020 04/02/2020  Decreased Interest 0 0  Down, Depressed, Hopeless 0 0  PHQ - 2 Score 0 0

## 2020-08-31 NOTE — Progress Notes (Signed)
Patient here today for a 1 week blood pressure check.  Per Dr. Loletha Grayer."- f/u in 1-2 wks for BP recheck and adjustment of med if needed.

## 2020-11-11 ENCOUNTER — Other Ambulatory Visit: Payer: Self-pay | Admitting: Family Medicine

## 2020-11-24 ENCOUNTER — Telehealth: Payer: Self-pay | Admitting: Family Medicine

## 2020-11-24 NOTE — Telephone Encounter (Signed)
I attempted to leave message for patient to call back and schedule Medicare Annual Wellness Visit (AWV) , but his mailbox isn't set up.   If patient returns my call, please offer to do virtually or by telephone.   Due for AWVI  Please schedule at anytime with Nurse Health Advisor.

## 2021-03-23 ENCOUNTER — Ambulatory Visit (INDEPENDENT_AMBULATORY_CARE_PROVIDER_SITE_OTHER): Payer: Medicare Other | Admitting: Nurse Practitioner

## 2021-03-23 ENCOUNTER — Encounter: Payer: Self-pay | Admitting: Nurse Practitioner

## 2021-03-23 ENCOUNTER — Other Ambulatory Visit: Payer: Self-pay

## 2021-03-23 VITALS — BP 134/84 | HR 72 | Temp 97.6°F | Wt 193.4 lb

## 2021-03-23 DIAGNOSIS — E039 Hypothyroidism, unspecified: Secondary | ICD-10-CM | POA: Diagnosis not present

## 2021-03-23 DIAGNOSIS — Z23 Encounter for immunization: Secondary | ICD-10-CM | POA: Diagnosis not present

## 2021-03-23 DIAGNOSIS — F411 Generalized anxiety disorder: Secondary | ICD-10-CM

## 2021-03-23 DIAGNOSIS — I10 Essential (primary) hypertension: Secondary | ICD-10-CM | POA: Diagnosis not present

## 2021-03-23 DIAGNOSIS — N521 Erectile dysfunction due to diseases classified elsewhere: Secondary | ICD-10-CM | POA: Insufficient documentation

## 2021-03-23 LAB — T4, FREE: Free T4: 0.85 ng/dL (ref 0.60–1.60)

## 2021-03-23 LAB — TSH: TSH: 3.86 u[IU]/mL (ref 0.35–5.50)

## 2021-03-23 MED ORDER — LISINOPRIL 20 MG PO TABS: 20.0000 mg | ORAL_TABLET | Freq: Every day | ORAL | 3 refills | Status: DC

## 2021-03-23 MED ORDER — HYDROXYZINE PAMOATE 25 MG PO CAPS: 25.0000 mg | ORAL_CAPSULE | Freq: Every evening | ORAL | 0 refills | Status: DC | PRN

## 2021-03-23 NOTE — Progress Notes (Addendum)
Subjective:  Patient ID: Nicholas Moody, male    DOB: September 09, 1955  Age: 65 y.o. MRN: [id number]  CC: Establish Care (TOC-Dr. Cirigliano/Medication discussion)  HPI  Hypertension BP at goal with lisinopril BP Readings from Last 3 Encounters:  03/23/21 134/84  08/31/20 138/80  08/25/20 (!) 148/86   Refill sent  check BMP during CPE visit in 2weeks.  Hypothyroidism Diagnosed 56yrs ago per patient. Denies hx of thyroid nodule or enlarged thyroid. No previous thyroid US completed Stable weight Reports hx of GAD which affects quality of sleep. No change Wt Readings from Last 3 Encounters:  03/23/21 193 lb 6.4 oz (87.7 kg)  08/25/20 195 lb 6.4 oz (88.6 kg)  04/02/20 182 lb (82.6 kg)   Repeat TSh and T4:stable Maintain current levothyroxine dose Refill sent  GAD (generalized anxiety disorder) Ongoing for years, waxing and waning Unable to tolerate zoloft (sleep walking and increased depression). Does not want to take another SSRI, but will like to try prn med for sleep. Has interrupted sleep and difficulty falling asleep due to racing thoughts. No improvement with melatonin.  Try vistaril at hs. Advised about possible side effects. F/up in 2weeks  Reviewed past Medical, Social and Family history today.  Outpatient Medications Prior to Visit  Medication Sig Dispense Refill   sildenafil (VIAGRA) 100 MG tablet TAKE ONE TABLET BY MOUTH AS DIRECTED FOR 90 DAYS     levothyroxine (SYNTHROID) 125 MCG tablet Take 125 mcg by mouth daily.     lisinopril (ZESTRIL) 20 MG tablet Take 1 tablet (20 mg total) by mouth daily. 90 tablet 3   sertraline (ZOLOFT) 100 MG tablet TAKE ONE TABLET BY MOUTH ONE TIME DAILY 90 tablet 3   No facility-administered medications prior to visit.    ROS See HPI  Objective:  BP 134/84 (BP Location: Left Arm, Patient Position: Sitting, Cuff Size: Large)   Pulse 72   Temp 97.6 F (36.4 C) (Temporal)   Wt 193 lb 6.4 oz (87.7 kg)   SpO2 98%   BMI  28.15 kg/m   Physical Exam Vitals reviewed.  Neck:     Thyroid: No thyroid mass, thyromegaly or thyroid tenderness.  Cardiovascular:     Rate and Rhythm: Normal rate and regular rhythm.     Pulses: Normal pulses.     Heart sounds: Normal heart sounds.  Pulmonary:     Effort: Pulmonary effort is normal.     Breath sounds: Normal breath sounds.  Musculoskeletal:     Cervical back: Normal range of motion.     Right lower leg: No edema.     Left lower leg: No edema.  Neurological:     Mental Status: He is alert and oriented to person, place, and time.  Psychiatric:        Mood and Affect: Mood normal.        Behavior: Behavior normal.        Thought Content: Thought content normal.   Assessment & Plan:  This visit occurred during the SARS-CoV-2 public health emergency.  Safety protocols were in place, including screening questions prior to the visit, additional usage of staff PPE, and extensive cleaning of exam room while observing appropriate contact time as indicated for disinfecting solutions.   Katrina was seen today for establish care.  Diagnoses and all orders for this visit:  Primary hypertension -     lisinopril (ZESTRIL) 20 MG tablet; Take 1 tablet (20 mg total) by mouth daily.  Flu vaccine need -  Flu Vaccine QUAD High Dose(Fluad)  Hypothyroidism, unspecified type -     TSH -     T4, free -     levothyroxine (SYNTHROID) 125 MCG tablet; Take 1 tablet (125 mcg total) by mouth daily.  GAD (generalized anxiety disorder) -     hydrOXYzine (VISTARIL) 25 MG capsule; Take 1 capsule (25 mg total) by mouth at bedtime as needed.   Problem List Items Addressed This Visit       Cardiovascular and Mediastinum   Hypertension - Primary    BP at goal with lisinopril BP Readings from Last 3 Encounters:  03/23/21 134/84  08/31/20 138/80  08/25/20 (!) 148/86   Refill sent  check BMP during CPE visit in 2weeks.      Relevant Medications   lisinopril (ZESTRIL) 20 MG  tablet     Endocrine   Hypothyroidism    Diagnosed 81yrs ago per patient. Denies hx of thyroid nodule or enlarged thyroid. No previous thyroid US completed Stable weight Reports hx of GAD which affects quality of sleep. No change Wt Readings from Last 3 Encounters:  03/23/21 193 lb 6.4 oz (87.7 kg)  08/25/20 195 lb 6.4 oz (88.6 kg)  04/02/20 182 lb (82.6 kg)   Repeat TSh and T4:stable Maintain current levothyroxine dose Refill sent      Relevant Medications   levothyroxine (SYNTHROID) 125 MCG tablet   Other Relevant Orders   TSH (Completed)   T4, free (Completed)     Other   GAD (generalized anxiety disorder)    Ongoing for years, waxing and waning Unable to tolerate zoloft (sleep walking and increased depression). Does not want to take another SSRI, but will like to try prn med for sleep. Has interrupted sleep and difficulty falling asleep due to racing thoughts. No improvement with melatonin.  Try vistaril at hs. Advised about possible side effects. F/up in 2weeks      Relevant Medications   hydrOXYzine (VISTARIL) 25 MG capsule   Other Visit Diagnoses     Flu vaccine need       Relevant Orders   Flu Vaccine QUAD High Dose(Fluad) (Completed)       Follow-up: Return in about 12 days (around 04/04/2021) for CPE (fasting).  Wilfred Lacy, NP

## 2021-03-23 NOTE — Assessment & Plan Note (Addendum)
Diagnosed 73yrs ago per patient. Denies hx of thyroid nodule or enlarged thyroid. No previous thyroid US completed Stable weight Reports hx of GAD which affects quality of sleep. No change Wt Readings from Last 3 Encounters:  03/23/21 193 lb 6.4 oz (87.7 kg)  08/25/20 195 lb 6.4 oz (88.6 kg)  04/02/20 182 lb (82.6 kg)   Repeat TSh and T4:stable Maintain current levothyroxine dose Refill sent

## 2021-03-23 NOTE — Patient Instructions (Signed)
Go to lab for blood draw. Maintain current medications  Hydroxyzine Capsules or Tablets What is this medication? HYDROXYZINE (hye [place] i zeen) treats the symptoms of allergies and allergic reactions. It may also be used to treat anxiety or cause drowsiness before a procedure. It works by blocking histamine, a substance released by the body during an allergic reaction. It belongs to a group of medications called antihistamines. This medicine may be used for other purposes; ask your health care provider or pharmacist if you have questions. COMMON BRAND NAME(S): ANX, Atarax, Rezine, Vistaril What should I tell my care team before I take this medication? They need to know if you have any of these conditions: Glaucoma Heart disease History of irregular heartbeat Kidney disease Liver disease Lung or breathing disease, like asthma Stomach or intestine problems Thyroid disease Trouble passing urine An unusual or allergic reaction to hydroxyzine, cetirizine, other medications, foods, dyes or preservatives Pregnant or trying to get pregnant Breast-feeding How should I use this medication? Take this medication by mouth with a full glass of water. Follow the directions on the prescription label. You may take this medication with food or on an empty stomach. Take your medication at regular intervals. Do not take your medication more often than directed. Talk to your care team regarding the use of this medication in children. Special care may be needed. While this medication may be prescribed for children as young as 61 years of age for selected conditions, precautions do apply. Patients over 67 years old may have a stronger reaction and need a smaller dose. Overdosage: If you think you have taken too much of this medicine contact a poison control center or emergency room at once. NOTE: This medicine is only for you. Do not share this medicine with others. What if I miss a dose? If you miss a dose,  take it as soon as you can. If it is almost time for your next dose, take only that dose. Do not take double or extra doses. What may interact with this medication? Do not take this medication with any of the following: Cisapride Dronedarone Pimozide Thioridazine This medication may also interact with the following: Alcohol Antihistamines for allergy, cough, and cold Atropine Barbiturate medications for sleep or seizures, like phenobarbital Certain antibiotics like erythromycin or clarithromycin Certain medications for anxiety or sleep Certain medications for bladder problems like oxybutynin, tolterodine Certain medications for depression or psychotic disturbances Certain medications for irregular heart beat Certain medications for Parkinson's disease like benztropine, trihexyphenidyl Certain medications for seizures like phenobarbital, primidone Certain medications for stomach problems like dicyclomine, hyoscyamine Certain medications for travel sickness like scopolamine Ipratropium Narcotic medications for pain Other medications that prolong the QT interval (which can cause an abnormal heart rhythm) like dofetilide This list may not describe all possible interactions. Give your health care provider a list of all the medicines, herbs, non-prescription drugs, or dietary supplements you use. Also tell them if you smoke, drink alcohol, or use illegal drugs. Some items may interact with your medicine. What should I watch for while using this medication? Tell your care team if your symptoms do not improve. You may get drowsy or dizzy. Do not drive, use machinery, or do anything that needs mental alertness until you know how this medication affects you. Do not stand or sit up quickly, especially if you are an older patient. This reduces the risk of dizzy or fainting spells. Alcohol may interfere with the effect of this medication. Avoid alcoholic drinks. Your  mouth may get dry. Chewing  sugarless gum or sucking hard candy, and drinking plenty of water may help. Contact your care team if the problem does not go away or is severe. This medication may cause dry eyes and blurred vision. If you wear contact lenses you may feel some discomfort. Lubricating drops may help. See your eye care specialist if the problem does not go away or is severe. If you are receiving skin tests for allergies, tell your care team you are using this medication. What side effects may I notice from receiving this medication? Side effects that you should report to your care team as soon as possible: Allergic reactions-skin rash, itching, hives, swelling of the face, lips, tongue, or throat Heart rhythm changes-fast or irregular heartbeat, dizziness, feeling faint or lightheaded, chest pain, trouble breathing Side effects that usually do not require medical attention (report to your care team if they continue or are bothersome): Confusion Drowsiness Dry mouth Hallucinations Headache This list may not describe all possible side effects. Call your doctor for medical advice about side effects. You may report side effects to FDA at 1-800-FDA-1088. Where should I keep my medication? Keep out of the reach of children and pets. Store at room temperature between 15 and 30 degrees C (59 and 86 degrees F). Keep container tightly closed. Throw away any unused medication after the expiration date. NOTE: This sheet is a summary. It may not cover all possible information. If you have questions about this medicine, talk to your doctor, pharmacist, or health care provider.  2022 Elsevier/Gold Standard (2020-08-17 15:19:25)

## 2021-03-23 NOTE — Assessment & Plan Note (Signed)
Ongoing for years, waxing and waning Unable to tolerate zoloft (sleep walking and increased depression). Does not want to take another SSRI, but will like to try prn med for sleep. Has interrupted sleep and difficulty falling asleep due to racing thoughts. No improvement with melatonin.  Try vistaril at hs. Advised about possible side effects. F/up in 2weeks

## 2021-03-23 NOTE — Assessment & Plan Note (Signed)
BP at goal with lisinopril BP Readings from Last 3 Encounters:  03/23/21 134/84  08/31/20 138/80  08/25/20 (!) 148/86   Refill sent  check BMP during CPE visit in 2weeks.

## 2021-03-24 MED ORDER — LEVOTHYROXINE SODIUM 125 MCG PO TABS: 125.0000 ug | ORAL_TABLET | Freq: Every day | ORAL | 1 refills | Status: DC

## 2021-03-24 NOTE — Addendum Note (Signed)
Addended by: Wilfred Lacy L on: 03/24/2021 08:10 AM   Modules accepted: Orders

## 2021-04-14 ENCOUNTER — Ambulatory Visit (INDEPENDENT_AMBULATORY_CARE_PROVIDER_SITE_OTHER): Payer: Medicare Other | Admitting: Nurse Practitioner

## 2021-04-14 ENCOUNTER — Other Ambulatory Visit: Payer: Self-pay

## 2021-04-14 ENCOUNTER — Encounter: Payer: Self-pay | Admitting: Nurse Practitioner

## 2021-04-14 VITALS — BP 132/86 | HR 60 | Temp 97.0°F | Ht 68.5 in | Wt 190.0 lb

## 2021-04-14 DIAGNOSIS — Z125 Encounter for screening for malignant neoplasm of prostate: Secondary | ICD-10-CM

## 2021-04-14 DIAGNOSIS — F411 Generalized anxiety disorder: Secondary | ICD-10-CM | POA: Diagnosis not present

## 2021-04-14 DIAGNOSIS — Z1211 Encounter for screening for malignant neoplasm of colon: Secondary | ICD-10-CM | POA: Diagnosis not present

## 2021-04-14 DIAGNOSIS — Z13220 Encounter for screening for lipoid disorders: Secondary | ICD-10-CM | POA: Diagnosis not present

## 2021-04-14 DIAGNOSIS — M2142 Flat foot [pes planus] (acquired), left foot: Secondary | ICD-10-CM | POA: Diagnosis not present

## 2021-04-14 DIAGNOSIS — Z800 Family history of malignant neoplasm of digestive organs: Secondary | ICD-10-CM

## 2021-04-14 DIAGNOSIS — M2141 Flat foot [pes planus] (acquired), right foot: Secondary | ICD-10-CM | POA: Diagnosis not present

## 2021-04-14 DIAGNOSIS — Z0001 Encounter for general adult medical examination with abnormal findings: Secondary | ICD-10-CM

## 2021-04-14 DIAGNOSIS — M2170 Unequal limb length (acquired), unspecified site: Secondary | ICD-10-CM

## 2021-04-14 DIAGNOSIS — Z136 Encounter for screening for cardiovascular disorders: Secondary | ICD-10-CM

## 2021-04-14 LAB — CBC WITH DIFFERENTIAL/PLATELET
Basophils Absolute: 0.1 10*3/uL (ref 0.0–0.1)
Basophils Relative: 0.7 % (ref 0.0–3.0)
Eosinophils Absolute: 0.2 10*3/uL (ref 0.0–0.7)
Eosinophils Relative: 2.3 % (ref 0.0–5.0)
HCT: 41.1 % (ref 39.0–52.0)
Hemoglobin: 14.2 g/dL (ref 13.0–17.0)
Lymphocytes Relative: 31.8 % (ref 12.0–46.0)
Lymphs Abs: 3.2 10*3/uL (ref 0.7–4.0)
MCHC: 34.5 g/dL (ref 30.0–36.0)
MCV: 94.4 fl (ref 78.0–100.0)
Monocytes Absolute: 0.6 10*3/uL (ref 0.1–1.0)
Monocytes Relative: 6 % (ref 3.0–12.0)
Neutro Abs: 6 10*3/uL (ref 1.4–7.7)
Neutrophils Relative %: 59.2 % (ref 43.0–77.0)
Platelets: 290 10*3/uL (ref 150.0–400.0)
RBC: 4.36 Mil/uL (ref 4.22–5.81)
RDW: 12.8 % (ref 11.5–15.5)
WBC: 10.1 10*3/uL (ref 4.0–10.5)

## 2021-04-14 LAB — LIPID PANEL
Cholesterol: 200 mg/dL (ref 0–200)
HDL: 43.2 mg/dL (ref >39.00)
LDL Cholesterol: 120 mg/dL — ABNORMAL HIGH (ref 0–99)
NonHDL: 156.65
Total CHOL/HDL Ratio: 5
Triglycerides: 185 mg/dL — ABNORMAL HIGH (ref 0.0–149.0)
VLDL: 37 mg/dL (ref 0.0–40.0)

## 2021-04-14 LAB — COMPREHENSIVE METABOLIC PANEL
ALT: 16 U/L (ref 0–53)
AST: 21 U/L (ref 0–37)
Albumin: 4.6 g/dL (ref 3.5–5.2)
Alkaline Phosphatase: 71 U/L (ref 39–117)
BUN: 7 mg/dL (ref 6–23)
CO2: 28 mEq/L (ref 19–32)
Calcium: 9.5 mg/dL (ref 8.4–10.5)
Chloride: 102 mEq/L (ref 96–112)
Creatinine, Ser: 0.89 mg/dL (ref 0.40–1.50)
GFR: 89.09 mL/min (ref >60.00)
Glucose, Bld: 85 mg/dL (ref 70–99)
Potassium: 4.2 mEq/L (ref 3.5–5.1)
Sodium: 138 mEq/L (ref 135–145)
Total Bilirubin: 0.5 mg/dL (ref 0.2–1.2)
Total Protein: 7 g/dL (ref 6.0–8.3)

## 2021-04-14 LAB — PSA: PSA: 0.62 ng/mL (ref 0.10–4.00)

## 2021-04-14 MED ORDER — HYDROXYZINE PAMOATE 50 MG PO CAPS: 50.0000 mg | ORAL_CAPSULE | Freq: Every evening | ORAL | 1 refills | Status: DC | PRN

## 2021-04-14 MED ORDER — DULOXETINE HCL 20 MG PO CPEP: 20.0000 mg | ORAL_CAPSULE | Freq: Every day | ORAL | 5 refills | Status: DC

## 2021-04-14 NOTE — Progress Notes (Signed)
Subjective:    Patient ID: Nicholas Moody, male    DOB: 1956/04/24, 65 y.o.   MRN: [id number]  Patient presents today for CPE and eval of chronic conditions  HPI GAD (generalized anxiety disorder) Improved sleep with vistaril 50mg , denies any adverse side effects. Persistent daytime anxiety: constantly feels overwhelmed and restlessness. This leads to social isolation. Denies referral to therapist. Agreed to start cymbalta Advised about potential side effects and not to take at same time with vistaril due to risk of sedation. He verbalized understanding. 20mg  RX sent F/up in 69month  Leg length discrepancy Right Needs referral to podiatry to for shoe orthotics  Vision:up to date Dental:up to date Diet:heart healthy Exercise:stationery bike x 6days a week, 3mins each day Weight:  Wt Readings from Last 3 Encounters:  04/14/21 190 lb (86.2 kg)  03/23/21 193 lb 6.4 oz (87.7 kg)  08/25/20 195 lb 6.4 oz (88.6 kg)    Sexual History (orientation,birth control, marital status, STD):married, denies need for STD screen. Agreed to GI referral for repeat colonoscopy. Fhx of colon cancer (father)  Depression/Suicide: Depression screen [facility] 2/9 04/14/2021 08/25/2020 04/02/2020  Decreased Interest 0 0 0  Down, Depressed, Hopeless 0 0 0  PHQ - 2 Score 0 0 0  Altered sleeping 3 - -  Tired, decreased energy 1 - -  Change in appetite 1 - -  Feeling bad or failure about yourself  0 - -  Trouble concentrating 0 - -  Moving slowly or fidgety/restless 1 - -  Suicidal thoughts 0 - -  PHQ-9 Score 6 - -  Difficult doing work/chores Somewhat difficult - -   GAD 7 : Generalized Anxiety Score 04/14/2021  Nervous, Anxious, on Edge 3  Control/stop worrying 2  Worry too much - different things 2  Trouble relaxing 2  Restless 2  Easily annoyed or irritable 2  Afraid - awful might happen 2  Total GAD 7 Score 15  Anxiety Difficulty Somewhat difficult   Immunizations: (TDAP, Hep C screen,  Pneumovax, Influenza, zoster)  Health Maintenance  Topic Date Due   Hepatitis C Screening: USPSTF Recommendation to screen - Ages 34-79 yo.  Never done   Tetanus Vaccine  03/21/2020   Colon Cancer Screening  05/04/2020   Pneumonia Vaccine  Completed   Flu Shot  Completed   COVID-19 Vaccine  Completed   Zoster (Shingles) Vaccine  Completed   HPV Vaccine  Aged Out   Fall Risk: Fall Risk  04/14/2021 08/25/2020 04/02/2020  Falls in the past year? 0 0 0  Number falls in past yr: 0 - 0  Injury with Fall? 0 - 0  Risk for fall due to : No Fall Risks - -  Follow up Falls evaluation completed - -   Advanced Directive:copy requested Advanced Directives 10/19/2011  Does Patient Have a Medical Advance Directive? Patient has advance directive, copy not in chart  Type of Advance Directive [place];Living will  Copy of [place] in Chart? (No Data)  Pre-existing out of facility DNR order (yellow form or pink MOST form) No    Medications and allergies reviewed with patient and updated if appropriate.  Patient Active Problem List   Diagnosis Date Noted   Flat foot 04/17/2021   Leg length discrepancy 04/17/2021   GAD (generalized anxiety disorder) 03/23/2021   Hypertension 03/23/2021   Hypothyroidism 03/23/2021   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 male 03/23/2021   SCOLIOSIS 03/21/2010   HEADACHE 03/21/2010  NEPHROLITHIASIS, HX OF 03/21/2010    Current Outpatient Medications on File Prior to Visit  Medication Sig Dispense Refill   levothyroxine (SYNTHROID) 125 MCG tablet Take 1 tablet (125 mcg total) by mouth daily. 90 tablet 1   lisinopril (ZESTRIL) 20 MG tablet Take 1 tablet (20 mg total) by mouth daily. 90 tablet 3   sildenafil (VIAGRA) 100 MG tablet TAKE ONE TABLET BY MOUTH AS DIRECTED FOR 90 DAYS     No current facility-administered medications on file prior to visit.   Past Medical History:  Diagnosis Date   Allergy    Anxiety     Asthma    Cancer ([place])    skin cancer x 2   Depression    History of renal stone 2011   lithotripsy   Hydrocele of testis    Hypertension    Scoliosis    sees chiropractor monthly   Scoliosis    Thyroid disease    Past Surgical History:  Procedure Laterality Date   HYDROCELE EXCISION  10/19/2011   Procedure: HYDROCELECTOMY ADULT;  Surgeon: Malka So, MD;  Location: [facility];  Service: Urology;  Laterality: Left;  left hydrocelectomy , left epididectomy    LITHOTRIPSY     x2   LITHOTRIPSY Right    Done twice in 2010   MOHS SURGERY     VASECTOMY      Social History   Socioeconomic History   Marital status: Married    Spouse name: Not on file   Number of children: Not on file   Years of education: Not on file   Highest education level: Not on file  Occupational History   Not on file  Tobacco Use   Smoking status: Never   Smokeless tobacco: Never  Vaping Use   Vaping Use: Never used  Substance and Sexual Activity   Alcohol use: Not Currently    Comment: 2/week   Drug use: Yes    Types: Marijuana   Sexual activity: Yes    Birth control/protection: Surgical  Other Topics Concern   Not on file  Social History Narrative   Not on file   Social Determinants of Health   Financial Resource Strain: Not on file  Food Insecurity: Not on file  Transportation Needs: Not on file  Physical Activity: Not on file  Stress: Not on file  Social Connections: Not on file    Family History  Problem Relation Age of Onset   Hypertension Mother    Heart disease Mother    Cancer Mother        bone   Hypertension Father    Cancer Father        bone and colon   Kidney failure Father    Alcohol abuse Father    Anxiety disorder Father    Early death Father        Review of Systems  Constitutional:  Negative for fever, malaise/fatigue and weight loss.  HENT:  Negative for congestion and sore throat.   Eyes:        Negative for visual changes   Respiratory:  Negative for cough and shortness of breath.   Cardiovascular:  Negative for chest pain, palpitations and leg swelling.  Gastrointestinal:  Negative for blood in stool, constipation, diarrhea and heartburn.  Genitourinary:  Negative for dysuria, frequency and urgency.  Musculoskeletal:  Negative for falls, joint pain and myalgias.  Skin:  Negative for rash.  Neurological:  Negative for dizziness, sensory change and headaches.  Endo/Heme/Allergies:  Does not bruise/bleed easily.  Psychiatric/Behavioral:  Negative for depression, hallucinations, substance abuse and suicidal ideas. The patient is nervous/anxious. The patient does not have insomnia.    Objective:   Vitals:   04/14/21 1035  BP: 132/86  Pulse: 60  Temp: (!) 97 F (36.1 C)  SpO2: 99%   Body mass index is 28.47 kg/m.  Physical Examination:  Physical Exam Vitals reviewed.  Constitutional:      General: He is not in acute distress.    Appearance: He is well-developed.  HENT:     Right Ear: Tympanic membrane, ear canal and external ear normal.     Left Ear: Tympanic membrane, ear canal and external ear normal.  Eyes:     Extraocular Movements: Extraocular movements intact.     Conjunctiva/sclera: Conjunctivae normal.  Cardiovascular:     Rate and Rhythm: Normal rate and regular rhythm.     Heart sounds: Normal heart sounds.  Pulmonary:     Effort: Pulmonary effort is normal. No respiratory distress.     Breath sounds: Normal breath sounds.  Chest:     Chest wall: No tenderness.  Abdominal:     General: Bowel sounds are normal.     Palpations: Abdomen is soft.  Musculoskeletal:        General: Normal range of motion.     Cervical back: Normal range of motion and neck supple.     Right lower leg: No edema.     Left lower leg: No edema.  Skin:    General: Skin is warm and dry.  Neurological:     Mental Status: He is alert and oriented to person, place, and time.     Deep Tendon Reflexes:  Reflexes are normal and symmetric.  Psychiatric:        Mood and Affect: Mood normal.        Behavior: Behavior normal.        Thought Content: Thought content normal.   ASSESSMENT and PLAN: This visit occurred during the SARS-CoV-2 public health emergency.  Safety protocols were in place, including screening questions prior to the visit, additional usage of staff PPE, and extensive cleaning of exam room while observing appropriate contact time as indicated for disinfecting solutions.   Nicholas Moody was seen today for annual exam.  Diagnoses and all orders for this visit:  Encounter for preventative adult health care exam with abnormal findings -     Ambulatory referral to Gastroenterology -     Comprehensive metabolic panel -     CBC with Differential/Platelet -     Lipid panel -     PSA  GAD (generalized anxiety disorder) -     hydrOXYzine (VISTARIL) 50 MG capsule; Take 1 capsule (50 mg total) by mouth at bedtime as needed. -     DULoxetine (CYMBALTA) 20 MG capsule; Take 1 capsule (20 mg total) by mouth daily.  Leg length discrepancy -     Ambulatory referral to Podiatry  Pes planus of both feet -     Ambulatory referral to Podiatry  Encounter for lipid screening for cardiovascular disease  Prostate cancer screening -     PSA  Colon cancer screening -     Ambulatory referral to Gastroenterology  Family history of colon cancer in father     Problem List Items Addressed This Visit       Other   Flat foot   Relevant Orders   Ambulatory referral to Podiatry   GAD (generalized  anxiety disorder)    Improved sleep with vistaril 50mg , denies any adverse side effects. Persistent daytime anxiety: constantly feels overwhelmed and restlessness. This leads to social isolation. Denies referral to therapist. Agreed to start cymbalta Advised about potential side effects and not to take at same time with vistaril due to risk of sedation. He verbalized understanding. 20mg  RX  sent F/up in 77month      Relevant Medications   hydrOXYzine (VISTARIL) 50 MG capsule   DULoxetine (CYMBALTA) 20 MG capsule   Leg length discrepancy    Right Needs referral to podiatry to for shoe orthotics      Relevant Orders   Ambulatory referral to Podiatry   Other Visit Diagnoses     Encounter for preventative adult health care exam with abnormal findings    -  Primary   Relevant Orders   Ambulatory referral to Gastroenterology   Comprehensive metabolic panel (Completed)   CBC with Differential/Platelet (Completed)   Lipid panel (Completed)   PSA (Completed)   Encounter for lipid screening for cardiovascular disease       Prostate cancer screening       Relevant Orders   PSA (Completed)   Colon cancer screening       Relevant Orders   Ambulatory referral to Gastroenterology   Family history of colon cancer in father           Follow up: Return in about 4 weeks (around 05/12/2021) for anxiety f/up.  Wilfred Lacy, NP

## 2021-04-14 NOTE — Patient Instructions (Signed)
Go to lab for blood draw.  You will be contacted to schedule appt with podiatry and GI.  Maintain vistaril at bedtime as needed for insomnia Start cymbalta 1cap daily for anxiety. F/up in 35month.  Preventive Care 44 Years and Older, Male Preventive care refers to lifestyle choices and visits with your health care provider that can promote health and wellness. This includes: A yearly physical exam. This is also called an annual wellness visit. Regular dental and eye exams. Immunizations. Screening for certain conditions. Healthy lifestyle choices, such as: Eating a healthy diet. Getting regular exercise. Not using drugs or products that contain nicotine and tobacco. Limiting alcohol use. What can I expect for my preventive care visit? Physical exam Your health care provider will check your: Height and weight. These may be used to calculate your BMI (body mass index). BMI is a measurement that tells if you are at a healthy weight. Heart rate and blood pressure. Body temperature. Skin for abnormal spots. Counseling Your health care provider may ask you questions about your: Past medical problems. Family's medical history. Alcohol, tobacco, and drug use. Emotional well-being. Home life and relationship well-being. Sexual activity. Diet, exercise, and sleep habits. History of falls. Memory and ability to understand (cognition). Work and work Statistician. Access to firearms. What immunizations do I need? Vaccines are usually given at various ages, according to a schedule. Your health care provider will recommend vaccines for you based on your age, medical history, and lifestyle or other factors, such as travel or where you work. What tests do I need? Blood tests Lipid and cholesterol levels. These may be checked every 5 years, or more often depending on your overall health. Hepatitis C test. Hepatitis B test. Screening Lung cancer screening. You may have this screening every  year starting at age 6 if you have a 30-pack-year history of smoking and currently smoke or have quit within the past 15 years. Colorectal cancer screening. All adults should have this screening starting at age 67 and continuing until age 74. Your health care provider may recommend screening at age 57 if you are at increased risk. You will have tests every 1-10 years, depending on your results and the type of screening test. Prostate cancer screening. Recommendations will vary depending on your family history and other risks. Genital exam to check for testicular cancer or hernias. Diabetes screening. This is done by checking your blood sugar (glucose) after you have not eaten for a while (fasting). You may have this done every 1-3 years. Abdominal aortic aneurysm (AAA) screening. You may need this if you are a current or former smoker. STD (sexually transmitted disease) testing, if you are at risk. Follow these instructions at home: Eating and drinking  Eat a diet that includes fresh fruits and vegetables, whole grains, lean protein, and low-fat dairy products. Limit your intake of foods with high amounts of sugar, saturated fats, and salt. Take vitamin and mineral supplements as recommended by your health care provider. Do not drink alcohol if your health care provider tells you not to drink. If you drink alcohol: Limit how much you have to 0-2 drinks a day. Be aware of how much alcohol is in your drink. In the [place] one drink equals one 12 oz bottle of beer (355 mL), one 5 oz glass of wine (148 mL), or one 1 oz glass of hard liquor (44 mL). Lifestyle Take daily care of your teeth and gums. Brush your teeth every morning and night with fluoride toothpaste.  Floss one time each day. Stay active. Exercise for at least 30 minutes 5 or more days each week. Do not use any products that contain nicotine or tobacco, such as cigarettes, e-cigarettes, and chewing tobacco. If you need help quitting,  ask your health care provider. Do not use drugs. If you are sexually active, practice safe sex. Use a condom or other form of protection to prevent STIs (sexually transmitted infections). Talk with your health care provider about taking a low-dose aspirin or statin. Find healthy ways to cope with stress, such as: Meditation, yoga, or listening to music. Journaling. Talking to a trusted person. Spending time with friends and family. Safety Always wear your seat belt while driving or riding in a vehicle. Do not drive: If you have been drinking alcohol. Do not ride with someone who has been drinking. When you are tired or distracted. While texting. Wear a helmet and other protective equipment during sports activities. If you have firearms in your house, make sure you follow all gun safety procedures. What's next? Visit your health care provider once a year for an annual wellness visit. Ask your health care provider how often you should have your eyes and teeth checked. Stay up to date on all vaccines. This information is not intended to replace advice given to you by your health care provider. Make sure you discuss any questions you have with your health care provider. Document Revised: 08/13/2020 Document Reviewed: 05/30/2018 Elsevier Patient Education  2022 [employer].

## 2021-04-17 DIAGNOSIS — M2140 Flat foot [pes planus] (acquired), unspecified foot: Secondary | ICD-10-CM | POA: Insufficient documentation

## 2021-04-17 DIAGNOSIS — M2170 Unequal limb length (acquired), unspecified site: Secondary | ICD-10-CM | POA: Insufficient documentation

## 2021-04-17 NOTE — Assessment & Plan Note (Signed)
Improved sleep with vistaril 50mg , denies any adverse side effects. Persistent daytime anxiety: constantly feels overwhelmed and restlessness. This leads to social isolation. Denies referral to therapist. Agreed to start cymbalta Advised about potential side effects and not to take at same time with vistaril due to risk of sedation. He verbalized understanding. 20mg  RX sent F/up in 35month

## 2021-04-17 NOTE — Assessment & Plan Note (Signed)
Right Needs referral to podiatry to for shoe orthotics

## 2021-04-25 ENCOUNTER — Other Ambulatory Visit: Payer: Self-pay

## 2021-04-25 ENCOUNTER — Encounter: Payer: Self-pay | Admitting: Podiatry

## 2021-04-25 ENCOUNTER — Ambulatory Visit (INDEPENDENT_AMBULATORY_CARE_PROVIDER_SITE_OTHER): Payer: Medicare Other | Admitting: Podiatry

## 2021-04-25 ENCOUNTER — Ambulatory Visit (INDEPENDENT_AMBULATORY_CARE_PROVIDER_SITE_OTHER): Payer: Medicare Other

## 2021-04-25 DIAGNOSIS — M79671 Pain in right foot: Secondary | ICD-10-CM

## 2021-04-25 DIAGNOSIS — M2170 Unequal limb length (acquired), unspecified site: Secondary | ICD-10-CM | POA: Diagnosis not present

## 2021-04-25 DIAGNOSIS — M79672 Pain in left foot: Secondary | ICD-10-CM

## 2021-04-25 DIAGNOSIS — M2142 Flat foot [pes planus] (acquired), left foot: Secondary | ICD-10-CM

## 2021-04-25 DIAGNOSIS — M2141 Flat foot [pes planus] (acquired), right foot: Secondary | ICD-10-CM

## 2021-04-25 NOTE — Progress Notes (Signed)
  Subjective:  Patient ID: Nicholas Moody, male    DOB: [birth year],   MRN: [id number]  Chief Complaint  Patient presents with   Foot Pain    Patient is complaining of bilateral plantar fasciitis    65 y.o. male presents for bilateral leg pain. Relates that his legs are different lengths. Was in an accident years ago that affected the legs. Has tried inserts in the past that were helpful for the leg pain.  States he has tried Aspercream, stretches and inserts with little relief.   . Denies any other pedal complaints. Denies n/v/f/c.   Past Medical History:  Diagnosis Date   Allergy    Anxiety    Asthma    Cancer ([place])    skin cancer x 2   Depression    History of renal stone 2011   lithotripsy   Hydrocele of testis    Hypertension    Scoliosis    sees chiropractor monthly   Scoliosis    Thyroid disease     Objective:  Physical Exam: Vascular: DP/PT pulses 2/4 bilateral. CFT <3 seconds. Normal hair growth on digits. No edema.  Skin. No lacerations or abrasions bilateral feet.  Musculoskeletal: MMT 5/5 bilateral lower extremities in DF, PF, Inversion and Eversion. Deceased ROM in DF of ankle joint. Mild eversion of the calcaneus bilateral. Visually right limb slightly shorter than left.  Neurological: Sensation intact to light touch.   Assessment:   1. Acquired unequal limb length   2. Bilateral pes planus      Plan:  Patient was evaluated and treated and all questions answered. -Xrays reviewed. Slight pes planus deformity with some degenerative changes noted at subtalar joint and ankle bilateral.  -Discussed treatement options; discussed pes planus deformity;conservative and  surgical  -Referred to pedorthist for limb length measuring and fitting of heel lift.  -Recommend good supportive shoes -Recommend daily stretching and icing -Recommend OTC anti-inflammatories  -Patient to return to office as needed or sooner if condition worsens.   Lorenda Peck, DPM

## 2021-05-27 ENCOUNTER — Other Ambulatory Visit: Payer: Self-pay

## 2021-05-30 ENCOUNTER — Ambulatory Visit (INDEPENDENT_AMBULATORY_CARE_PROVIDER_SITE_OTHER): Payer: Medicare Other | Admitting: Nurse Practitioner

## 2021-05-30 ENCOUNTER — Other Ambulatory Visit: Payer: Self-pay

## 2021-05-30 ENCOUNTER — Encounter: Payer: Self-pay | Admitting: Nurse Practitioner

## 2021-05-30 VITALS — BP 124/88 | HR 68 | Temp 96.9°F | Ht 68.0 in | Wt 197.6 lb

## 2021-05-30 DIAGNOSIS — Z789 Other specified health status: Secondary | ICD-10-CM | POA: Diagnosis not present

## 2021-05-30 DIAGNOSIS — Z7189 Other specified counseling: Secondary | ICD-10-CM | POA: Diagnosis not present

## 2021-05-30 DIAGNOSIS — F411 Generalized anxiety disorder: Secondary | ICD-10-CM

## 2021-05-30 HISTORY — DX: Other specified counseling: Z71.89

## 2021-05-30 MED ORDER — DULOXETINE HCL 20 MG PO CPEP: 20.0000 mg | ORAL_CAPSULE | Freq: Every day | ORAL | 3 refills | Status: DC

## 2021-05-30 NOTE — Patient Instructions (Signed)
Call [facility] to schedule appt for repeat colonoscopy. Maintain current medication dose F/up in 56months

## 2021-05-30 NOTE — Assessment & Plan Note (Signed)
Improved mood with cymbalta 20mg  Denies any adverse side effects.  Maintain current med dose Refill sent F/up in 35months

## 2021-05-30 NOTE — Assessment & Plan Note (Signed)
He provided a copy of HCPOA with no restriction: [name] Completed MOST form: full code Copies were sent to be scanned into his chart.

## 2021-05-30 NOTE — Progress Notes (Signed)
   Subjective:  Patient ID: Nicholas Moody, male    DOB: [birth year]  Age: 65 y.o. MRN: [id number]  CC: Follow-up (4 week f/u on anxiety. Pt states he has noticed an improvement and feels like he has less anxiety. )  HPI  GAD (generalized anxiety disorder) Improved mood with cymbalta 20mg  Denies any adverse side effects.  Maintain current med dose Refill sent F/up in 23months  Advanced care planning/counseling discussion He provided a copy of HCPOA with no restriction: [name] Completed MOST form: full code Copies were sent to be scanned into his chart.  Reviewed past Medical, Social and Family history today.  Outpatient Medications Prior to Visit  Medication Sig Dispense Refill   hydrOXYzine (VISTARIL) 50 MG capsule Take 1 capsule (50 mg total) by mouth at bedtime as needed. 90 capsule 1   levothyroxine (SYNTHROID) 125 MCG tablet Take 1 tablet (125 mcg total) by mouth daily. 90 tablet 1   lisinopril (ZESTRIL) 20 MG tablet Take 1 tablet (20 mg total) by mouth daily. 90 tablet 3   sildenafil (VIAGRA) 100 MG tablet TAKE ONE TABLET BY MOUTH AS DIRECTED FOR 90 DAYS     DULoxetine (CYMBALTA) 20 MG capsule Take 1 capsule (20 mg total) by mouth daily. 30 capsule 5   No facility-administered medications prior to visit.    ROS See HPI  Objective:  BP 124/88 (BP Location: Left Arm, Patient Position: Sitting, Cuff Size: Large)   Pulse 68   Temp (!) 96.9 F (36.1 C) (Temporal)   Ht 5\' 8"  (1.727 m)   Wt 197 lb 9.6 oz (89.6 kg)   SpO2 97%   BMI 30.04 kg/m   Physical Exam Cardiovascular:     Rate and Rhythm: Normal rate.     Pulses: Normal pulses.  Pulmonary:     Effort: Pulmonary effort is normal.  Neurological:     Mental Status: He is alert and oriented to person, place, and time.  Psychiatric:        Mood and Affect: Mood normal.        Behavior: Behavior normal.        Thought Content: Thought content normal.   Assessment & Plan:  This visit occurred during the  SARS-CoV-2 public health emergency.  Safety protocols were in place, including screening questions prior to the visit, additional usage of staff PPE, and extensive cleaning of exam room while observing appropriate contact time as indicated for disinfecting solutions.   [name] was seen today for follow-up.  Diagnoses and all orders for this visit:  GAD (generalized anxiety disorder) -     DULoxetine (CYMBALTA) 20 MG capsule; Take 1 capsule (20 mg total) by mouth daily.  Advanced care planning/counseling discussion  Patient is full code  Problem List Items Addressed This Visit       Other   Advanced care planning/counseling discussion    He provided a copy of HCPOA with no restriction: [name] Completed MOST form: full code Copies were sent to be scanned into his chart.      GAD (generalized anxiety disorder) - Primary    Improved mood with cymbalta 20mg  Denies any adverse side effects.  Maintain current med dose Refill sent F/up in 86months      Relevant Medications   DULoxetine (CYMBALTA) 20 MG capsule   Patient is full code    Follow-up: Return in about 3 months (around 08/28/2021) for anxiety, hypothyrodisn, HTN, hyperlipidemia(fasting).  Wilfred Lacy, NP

## 2021-07-08 ENCOUNTER — Other Ambulatory Visit: Payer: Self-pay

## 2021-07-08 ENCOUNTER — Ambulatory Visit: Payer: Medicare Other

## 2021-07-08 DIAGNOSIS — M2141 Flat foot [pes planus] (acquired), right foot: Secondary | ICD-10-CM

## 2021-07-08 DIAGNOSIS — M2142 Flat foot [pes planus] (acquired), left foot: Secondary | ICD-10-CM

## 2021-07-08 NOTE — Progress Notes (Signed)
Discussed financial responsibility with patient. Patient declines foot orthotics services at this time and requests a simple heel wedge for a minor leg length discrepancy. 3/8" crepe heel wedge was provided and fit to patient's shoe. Patient was satisfied with fit and function and will follow up as necessary. Plan of care regarding foot orthotics to resume at patient's discretion. All questions answered and concerns addressed.

## 2021-07-13 ENCOUNTER — Ambulatory Visit (INDEPENDENT_AMBULATORY_CARE_PROVIDER_SITE_OTHER): Payer: Medicare Other

## 2021-07-13 DIAGNOSIS — Z1211 Encounter for screening for malignant neoplasm of colon: Secondary | ICD-10-CM

## 2021-07-13 DIAGNOSIS — Z0000 Encounter for general adult medical examination without abnormal findings: Secondary | ICD-10-CM

## 2021-07-13 NOTE — Patient Instructions (Signed)
Nicholas Moody , Thank you for taking time to come for your Medicare Wellness Visit. I appreciate your ongoing commitment to your health goals. Please review the following plan we discussed and let me know if I can assist you in the future.   Screening recommendations/referrals: Colonoscopy: referral 07/13/2021 Recommended yearly ophthalmology/optometry visit for glaucoma screening and checkup Recommended yearly dental visit for hygiene and checkup  Vaccinations: Influenza vaccine: completed  Pneumococcal vaccine: due  Tdap vaccine: due  Shingles vaccine: completed     Advanced directives: none   Conditions/risks identified: none   Next appointment: none   Preventive Care 3 Years and Older, Male Preventive care refers to lifestyle choices and visits with your health care provider that can promote health and wellness. What does preventive care include? A yearly physical exam. This is also called an annual well check. Dental exams once or twice a year. Routine eye exams. Ask your health care provider how often you should have your eyes checked. Personal lifestyle choices, including: Daily care of your teeth and gums. Regular physical activity. Eating a healthy diet. Avoiding tobacco and drug use. Limiting alcohol use. Practicing safe sex. Taking low doses of aspirin every day. Taking vitamin and mineral supplements as recommended by your health care provider. What happens during an annual well check? The services and screenings done by your health care provider during your annual well check will depend on your age, overall health, lifestyle risk factors, and family history of disease. Counseling  Your health care provider may ask you questions about your: Alcohol use. Tobacco use. Drug use. Emotional well-being. Home and relationship well-being. Sexual activity. Eating habits. History of falls. Memory and ability to understand (cognition). Work and work  Statistician. Screening  You may have the following tests or measurements: Height, weight, and BMI. Blood pressure. Lipid and cholesterol levels. These may be checked every 5 years, or more frequently if you are over 36 years old. Skin check. Lung cancer screening. You may have this screening every year starting at age 59 if you have a 30-pack-year history of smoking and currently smoke or have quit within the past 15 years. Fecal occult blood test (FOBT) of the stool. You may have this test every year starting at age 67. Flexible sigmoidoscopy or colonoscopy. You may have a sigmoidoscopy every 5 years or a colonoscopy every 10 years starting at age 57. Prostate cancer screening. Recommendations will vary depending on your family history and other risks. Hepatitis C blood test. Hepatitis B blood test. Sexually transmitted disease (STD) testing. Diabetes screening. This is done by checking your blood sugar (glucose) after you have not eaten for a while (fasting). You may have this done every 1-3 years. Abdominal aortic aneurysm (AAA) screening. You may need this if you are a current or former smoker. Osteoporosis. You may be screened starting at age 30 if you are at high risk. Talk with your health care provider about your test results, treatment options, and if necessary, the need for more tests. Vaccines  Your health care provider may recommend certain vaccines, such as: Influenza vaccine. This is recommended every year. Tetanus, diphtheria, and acellular pertussis (Tdap, Td) vaccine. You may need a Td booster every 10 years. Zoster vaccine. You may need this after age 89. Pneumococcal 13-valent conjugate (PCV13) vaccine. One dose is recommended after age 55. Pneumococcal polysaccharide (PPSV23) vaccine. One dose is recommended after age 65. Talk to your health care provider about which screenings and vaccines you need and how often  you need them. This information is not intended to replace  advice given to you by your health care provider. Make sure you discuss any questions you have with your health care provider. Document Released: 07/02/2015 Document Revised: 02/23/2016 Document Reviewed: 04/06/2015 Elsevier Interactive Patient Education  2017 [place] Prevention in the Home Falls can cause injuries. They can happen to people of all ages. There are many things you can do to make your home safe and to help prevent falls. What can I do on the outside of my home? Regularly fix the edges of walkways and driveways and fix any cracks. Remove anything that might make you trip as you walk through a door, such as a raised step or threshold. Trim any bushes or trees on the path to your home. Use bright outdoor lighting. Clear any walking paths of anything that might make someone trip, such as rocks or tools. Regularly check to see if handrails are loose or broken. Make sure that both sides of any steps have handrails. Any raised decks and porches should have guardrails on the edges. Have any leaves, snow, or ice cleared regularly. Use sand or salt on walking paths during winter. Clean up any spills in your garage right away. This includes oil or grease spills. What can I do in the bathroom? Use night lights. Install grab bars by the toilet and in the tub and shower. Do not use towel bars as grab bars. Use non-skid mats or decals in the tub or shower. If you need to sit down in the shower, use a plastic, non-slip stool. Keep the floor dry. Clean up any water that spills on the floor as soon as it happens. Remove soap buildup in the tub or shower regularly. Attach bath mats securely with double-sided non-slip rug tape. Do not have throw rugs and other things on the floor that can make you trip. What can I do in the bedroom? Use night lights. Make sure that you have a light by your bed that is easy to reach. Do not use any sheets or blankets that are too big for your bed.  They should not hang down onto the floor. Have a firm chair that has side arms. You can use this for support while you get dressed. Do not have throw rugs and other things on the floor that can make you trip. What can I do in the kitchen? Clean up any spills right away. Avoid walking on wet floors. Keep items that you use a lot in easy-to-reach places. If you need to reach something above you, use a strong step stool that has a grab bar. Keep electrical cords out of the way. Do not use floor polish or wax that makes floors slippery. If you must use wax, use non-skid floor wax. Do not have throw rugs and other things on the floor that can make you trip. What can I do with my stairs? Do not leave any items on the stairs. Make sure that there are handrails on both sides of the stairs and use them. Fix handrails that are broken or loose. Make sure that handrails are as long as the stairways. Check any carpeting to make sure that it is firmly attached to the stairs. Fix any carpet that is loose or worn. Avoid having throw rugs at the top or bottom of the stairs. If you do have throw rugs, attach them to the floor with carpet tape. Make sure that you have a light  switch at the top of the stairs and the bottom of the stairs. If you do not have them, ask someone to add them for you. What else can I do to help prevent falls? Wear shoes that: Do not have high heels. Have rubber bottoms. Are comfortable and fit you well. Are closed at the toe. Do not wear sandals. If you use a stepladder: Make sure that it is fully opened. Do not climb a closed stepladder. Make sure that both sides of the stepladder are locked into place. Ask someone to hold it for you, if possible. Clearly mark and make sure that you can see: Any grab bars or handrails. First and last steps. Where the edge of each step is. Use tools that help you move around (mobility aids) if they are needed. These  include: Canes. Walkers. Scooters. Crutches. Turn on the lights when you go into a dark area. Replace any light bulbs as soon as they burn out. Set up your furniture so you have a clear path. Avoid moving your furniture around. If any of your floors are uneven, fix them. If there are any pets around you, be aware of where they are. Review your medicines with your doctor. Some medicines can make you feel dizzy. This can increase your chance of falling. Ask your doctor what other things that you can do to help prevent falls. This information is not intended to replace advice given to you by your health care provider. Make sure you discuss any questions you have with your health care provider. Document Released: 04/01/2009 Document Revised: 11/11/2015 Document Reviewed: 07/10/2014 Elsevier Interactive Patient Education  2017 [employer].

## 2021-07-13 NOTE — Progress Notes (Signed)
Subjective:   Nicholas Moody is a 66 y.o. male who presents for an Initial Medicare Annual Wellness Visit.  I connected with Loyola Mast today by telephone and verified that I am speaking with the correct person using two identifiers. Location patient: home Location provider: work Persons participating in the virtual visit: patient, provider.   I discussed the limitations, risks, security and privacy concerns of performing an evaluation and management service by telephone and the availability of in person appointments. I also discussed with the patient that there may be a patient responsible charge related to this service. The patient expressed understanding and verbally consented to this telephonic visit.    Interactive audio and video telecommunications were attempted between this provider and patient, however failed, due to patient having technical difficulties OR patient did not have access to video capability.  We continued and completed visit with audio only.    Review of Systems     Cardiac Risk Factors include: advanced age (>63men, >65 women);male gender     Objective:    Today's Vitals   There is no height or weight on file to calculate BMI.  Advanced Directives 07/13/2021 05/30/2021 10/19/2011 10/16/2011  Does Patient Have a Medical Advance Directive? Yes Yes Patient has advance directive, copy not in chart Patient has advance directive, copy not in chart  Type of Advance Directive [place];Living will [place];Living will;Out of facility DNR (pink MOST or yellow form) [place];Living will [place] in Chart? No - copy requested Yes - validated most recent copy scanned in chart (See row information) (No Data) -  Pre-existing out of facility DNR order (yellow form or pink MOST form) - Pink MOST/Yellow Form most recent copy in chart - Physician notified to  receive inpatient order No -    Current Medications (verified) Outpatient Encounter Medications as of 07/13/2021  Medication Sig   DULoxetine (CYMBALTA) 20 MG capsule Take 1 capsule (20 mg total) by mouth daily.   hydrOXYzine (VISTARIL) 50 MG capsule Take 1 capsule (50 mg total) by mouth at bedtime as needed.   levothyroxine (SYNTHROID) 125 MCG tablet Take 1 tablet (125 mcg total) by mouth daily.   lisinopril (ZESTRIL) 20 MG tablet Take 1 tablet (20 mg total) by mouth daily.   sildenafil (VIAGRA) 100 MG tablet TAKE ONE TABLET BY MOUTH AS DIRECTED FOR 90 DAYS   No facility-administered encounter medications on file as of 07/13/2021.    Allergies (verified) Lactose intolerance (gi)   History: Past Medical History:  Diagnosis Date   Allergy    Anxiety    Asthma    Cancer ([place])    skin cancer x 2   Depression    History of renal stone 2011   lithotripsy   Hydrocele of testis    Hypertension    Scoliosis    sees chiropractor monthly   Scoliosis    Thyroid disease    Past Surgical History:  Procedure Laterality Date   HYDROCELE EXCISION  10/19/2011   Procedure: HYDROCELECTOMY ADULT;  Surgeon: Malka So, MD;  [facility];  Service: Urology;  Laterality: Left;  left hydrocelectomy , left epididectomy    LITHOTRIPSY     x2   LITHOTRIPSY Right    Done twice in 2010   MOHS SURGERY     VASECTOMY     Family History  Problem Relation Age of Onset  Hypertension Mother    Heart disease Mother    Cancer Mother        bone   Hypertension Father    Cancer Father        bone and colon   Kidney failure Father    Alcohol abuse Father    Anxiety disorder Father    Early death Father    Social History   Socioeconomic History   Marital status: Married    Spouse name: Not on file   Number of children: Not on file   Years of education: Not on file   Highest education level: Not on file  Occupational History   Not on file  Tobacco Use   Smoking  status: Never   Smokeless tobacco: Never  Vaping Use   Vaping Use: Never used  Substance and Sexual Activity   Alcohol use: Not Currently    Comment: 2/week   Drug use: Yes    Types: Marijuana   Sexual activity: Yes    Birth control/protection: Surgical  Other Topics Concern   Not on file  Social History Narrative   Not on file   Social Determinants of Health   Financial Resource Strain: Low Risk    Difficulty of Paying Living Expenses: Not hard at all  Food Insecurity: No Food Insecurity   Worried About Charity fundraiser in the Last Year: Never true   [place] in the Last Year: Never true  Transportation Needs: No Transportation Needs   Lack of Transportation (Medical): No   Lack of Transportation (Non-Medical): No  Physical Activity: Inactive   Days of Exercise per Week: 0 days   Minutes of Exercise per Session: 0 min  Stress: No Stress Concern Present   Feeling of Stress : Not at all  Social Connections: Moderately Isolated   Frequency of Communication with Friends and Family: Twice a week   Frequency of Social Gatherings with Friends and Family: Twice a week   Attends Religious Services: Never   Printmaker: No   Attends Music therapist: Never   Marital Status: Married    Tobacco Counseling Counseling given: Not Answered   Clinical Intake:  Pre-visit preparation completed: Yes  Pain : No/denies pain     Nutritional Risks: None Diabetes: No  How often do you need to have someone help you when you read instructions, pamphlets, or other written materials from your doctor or pharmacy?: 1 - Never What is the last grade level you completed in school?: college  Diabetic?no   Interpreter Needed?: No  Information entered by :: [place] of Daily Living In your present state of health, do you have any difficulty performing the following activities: 07/13/2021  Hearing? N  Vision? N   Difficulty concentrating or making decisions? N  Walking or climbing stairs? N  Dressing or bathing? N  Doing errands, shopping? N  Preparing Food and eating ? N  Using the Toilet? N  In the past six months, have you accidently leaked urine? N  Do you have problems with loss of bowel control? N  Managing your Medications? N  Managing your Finances? N  Housekeeping or managing your Housekeeping? N  Some recent data might be hidden    Patient Care Team: Nche, Charlene Brooke, NP as PCP - General (Internal Medicine)  Indicate any recent Medical Services you may have received from other than [facility] providers in the past year (date may be  approximate).     Assessment:   This is a routine wellness examination for Emeka.  Hearing/Vision screen Vision Screening - Comments:: Annual eye exams wears glasses   Dietary issues and exercise activities discussed: Current Exercise Habits: The patient does not participate in regular exercise at present, Exercise limited by: None identified   Goals Addressed   None    Depression Screen PHQ 2/9 Scores 07/13/2021 07/13/2021 05/30/2021 04/14/2021 08/25/2020 04/02/2020  PHQ - 2 Score 0 0 0 0 0 0  PHQ- 9 Score - - 1 6 - -    Fall Risk Fall Risk  07/13/2021 05/30/2021 04/14/2021 08/25/2020 04/02/2020  Falls in the past year? 0 0 0 0 0  Number falls in past yr: 0 0 0 - 0  Injury with Fall? 0 0 0 - 0  Risk for fall due to : No Fall Risks No Fall Risks No Fall Risks - -  Follow up Falls evaluation completed Falls evaluation completed Falls evaluation completed - -    FALL RISK PREVENTION PERTAINING TO THE HOME:  Any stairs in or around the home? Yes  If so, are there any without handrails? No  Home free of loose throw rugs in walkways, pet beds, electrical cords, etc? Yes  Adequate lighting in your home to reduce risk of falls? Yes   ASSISTIVE DEVICES UTILIZED TO PREVENT FALLS:  Life alert? No  Use of a cane, walker or w/c? No  Grab bars in the  bathroom? No  Shower chair or bench in shower? No  Elevated toilet seat or a handicapped toilet? No    Cognitive Function:  Normal cognitive status assessed by direct observation by this Nurse Health Advisor. No abnormalities found.        Immunizations Immunization History  Administered Date(s) Administered   Fluad Quad(high Dose 65+) 04/02/2020, 03/23/2021   Influenza Whole 03/21/2010   Influenza-Unspecified 03/28/2021   Moderna Sars-Covid-2 Vaccination 08/20/2019, 09/17/2019, 04/20/2020, 01/05/2021, 03/28/2021   PNEUMOCOCCAL CONJUGATE-20 09/27/2020   Td 03/21/2010   Zoster Recombinat (Shingrix) 12/28/2020, 03/28/2021    TDAP status: Due, Education has been provided regarding the importance of this vaccine. Advised may receive this vaccine at local pharmacy or Health Dept. Aware to provide a copy of the vaccination record if obtained from local pharmacy or Health Dept. Verbalized acceptance and understanding.  Flu Vaccine status: Up to date  Pneumococcal vaccine status: Up to date  Covid-19 vaccine status: Completed vaccines  Qualifies for Shingles Vaccine? Yes   Zostavax completed No   Shingrix Completed?: No.    Education has been provided regarding the importance of this vaccine. Patient has been advised to call insurance company to determine out of pocket expense if they have not yet received this vaccine. Advised may also receive vaccine at local pharmacy or Health Dept. Verbalized acceptance and understanding.  Screening Tests Health Maintenance  Topic Date Due   Hepatitis C Screening  Never done   TETANUS/TDAP  03/21/2020   COLONOSCOPY (Pts 45-82yrs Insurance coverage will need to be confirmed)  05/04/2020   Pneumonia Vaccine 81+ Years old  Completed   INFLUENZA VACCINE  Completed   COVID-19 Vaccine  Completed   Zoster Vaccines- Shingrix  Completed   HPV VACCINES  Aged Out    Health Maintenance  Health Maintenance Due  Topic Date Due   Hepatitis C  Screening  Never done   TETANUS/TDAP  03/21/2020   COLONOSCOPY (Pts 45-11yrs Insurance coverage will need to be confirmed)  05/04/2020  Colorectal cancer screening: Referral to GI placed 07/13/2021. Pt aware the office will call re: appt.  Lung Cancer Screening: (Low Dose CT Chest recommended if Age 31-80 years, 30 pack-year currently smoking OR have quit w/in 15years.) does not qualify.   Lung Cancer Screening Referral: n/a  Additional Screening:  Hepatitis C Screening: does qualify;   Vision Screening: Recommended annual ophthalmology exams for early detection of glaucoma and other disorders of the eye. Is the patient up to date with their annual eye exam?  Yes  Who is the provider or what is the name of the office in which the patient attends annual eye exams? [facility]  If pt is not established with a provider, would they like to be referred to a provider to establish care? No .   Dental Screening: Recommended annual dental exams for proper oral hygiene  Community Resource Referral / Chronic Care Management: CRR required this visit?  No   CCM required this visit?  No      Plan:     I have personally reviewed and noted the following in the patients chart:   Medical and social history Use of alcohol, tobacco or illicit drugs  Current medications and supplements including opioid prescriptions. Patient is not currently taking opioid prescriptions. Functional ability and status Nutritional status Physical activity Advanced directives List of other physicians Hospitalizations, surgeries, and ER visits in previous 12 months Vitals Screenings to include cognitive, depression, and falls Referrals and appointments  In addition, I have reviewed and discussed with patient certain preventive protocols, quality metrics, and best practice recommendations. A written personalized care plan for preventive services as well as general preventive health recommendations were  provided to patient.     Randel Pigg, LPN   0/13/1438   Nurse Notes: none

## 2021-07-19 ENCOUNTER — Encounter: Payer: Self-pay | Admitting: Gastroenterology

## 2021-08-30 ENCOUNTER — Ambulatory Visit (AMBULATORY_SURGERY_CENTER): Payer: Medicare Other

## 2021-08-30 ENCOUNTER — Other Ambulatory Visit: Payer: Self-pay

## 2021-08-30 VITALS — Ht 68.0 in | Wt 200.0 lb

## 2021-08-30 DIAGNOSIS — Z800 Family history of malignant neoplasm of digestive organs: Secondary | ICD-10-CM

## 2021-08-30 MED ORDER — NA SULFATE-K SULFATE-MG SULF 17.5-3.13-1.6 GM/177ML PO SOLN: 1.0000 | Freq: Once | ORAL | 0 refills | Status: AC

## 2021-08-30 NOTE — Progress Notes (Signed)

## 2021-08-31 ENCOUNTER — Encounter: Payer: Self-pay | Admitting: Gastroenterology

## 2021-09-13 ENCOUNTER — Other Ambulatory Visit: Payer: Self-pay

## 2021-09-13 ENCOUNTER — Encounter: Payer: Self-pay | Admitting: Gastroenterology

## 2021-09-13 ENCOUNTER — Ambulatory Visit (AMBULATORY_SURGERY_CENTER): Payer: Medicare Other | Admitting: Gastroenterology

## 2021-09-13 VITALS — BP 130/88 | HR 63 | Temp 97.1°F | Resp 19 | Ht 68.0 in | Wt 200.0 lb

## 2021-09-13 DIAGNOSIS — D123 Benign neoplasm of transverse colon: Secondary | ICD-10-CM

## 2021-09-13 DIAGNOSIS — Z86010 Personal history of colonic polyps: Secondary | ICD-10-CM | POA: Diagnosis not present

## 2021-09-13 DIAGNOSIS — D122 Benign neoplasm of ascending colon: Secondary | ICD-10-CM

## 2021-09-13 DIAGNOSIS — Z800 Family history of malignant neoplasm of digestive organs: Secondary | ICD-10-CM

## 2021-09-13 MED ORDER — SODIUM CHLORIDE 0.9 % IV SOLN: 500.0000 mL | Freq: Once | INTRAVENOUS | Status: DC

## 2021-09-13 NOTE — Patient Instructions (Signed)
Discharge instructions given. ?Handouts on polyps.diverticulosis and hemorrhoids. ?Resume previous medications. ?YOU HAD AN ENDOSCOPIC PROCEDURE TODAY AT [facility]:   Refer to the procedure report that was given to you for any specific questions about what was found during the examination.  If the procedure report does not answer your questions, please call your gastroenterologist to clarify.  If you requested that your care partner not be given the details of your procedure findings, then the procedure report has been included in a sealed envelope for you to review at your convenience later. ? ?YOU SHOULD EXPECT: Some feelings of bloating in the abdomen. Passage of more gas than usual.  Walking can help get rid of the air that was put into your GI tract during the procedure and reduce the bloating. If you had a lower endoscopy (such as a colonoscopy or flexible sigmoidoscopy) you may notice spotting of blood in your stool or on the toilet paper. If you underwent a bowel prep for your procedure, you may not have a normal bowel movement for a few days. ? ?Please Note:  You might notice some irritation and congestion in your nose or some drainage.  This is from the oxygen used during your procedure.  There is no need for concern and it should clear up in a day or so. ? ?SYMPTOMS TO REPORT IMMEDIATELY: ? ?Following lower endoscopy (colonoscopy or flexible sigmoidoscopy): ? Excessive amounts of blood in the stool ? Significant tenderness or worsening of abdominal pains ? Swelling of the abdomen that is new, acute ? Fever of 100?F or higher ? ? ?For urgent or emergent issues, a gastroenterologist can be reached at any hour by calling [phone]. ?Do not use MyChart messaging for urgent concerns.  ? ? ?DIET:  We do recommend a small meal at first, but then you may proceed to your regular diet.  Drink plenty of fluids but you should avoid alcoholic beverages for 24 hours. ? ?ACTIVITY:  You should  plan to take it easy for the rest of today and you should NOT DRIVE or use heavy machinery until tomorrow (because of the sedation medicines used during the test).   ? ?FOLLOW UP: ?Our staff will call the number listed on your records 48-72 hours following your procedure to check on you and address any questions or concerns that you may have regarding the information given to you following your procedure. If we do not reach you, we will leave a message.  We will attempt to reach you two times.  During this call, we will ask if you have developed any symptoms of COVID 19. If you develop any symptoms (ie: fever, flu-like symptoms, shortness of breath, cough etc.) before then, please call [phone].  If you test positive for Covid 19 in the 2 weeks post procedure, please call and report this information to [place].   ? ?If any biopsies were taken you will be contacted by phone or by letter within the next 1-3 weeks.  Please call us at [phone] if you have not heard about the biopsies in 3 weeks.  ? ? ?SIGNATURES/CONFIDENTIALITY: ?You and/or your care partner have signed paperwork which will be entered into your electronic medical record.  These signatures attest to the fact that that the information above on your After Visit Summary has been reviewed and is understood.  Full responsibility of the confidentiality of this discharge information lies with you and/or your care-partner.  ?

## 2021-09-13 NOTE — Op Note (Signed)
[place] ?Patient Name: Nicholas Moody ?Procedure Date: 09/13/2021 7:24 AM ?MRN: [id number] ?Endoscopist: Milus Banister , MD ?Age: 66 ?Referring MD:  ?Date of Birth: [birth year] ?Gender: Male ?Account #: [id number] ?Procedure:                Colonoscopy ?Indications:              Screening in patient at increased risk: Father had  ?                          colon cancer in his early 28s; Colonsocopy Dr.  ?                          Sharlett Iles 2011 single HP ?Medicines:                Monitored Anesthesia Care ?Procedure:                Pre-Anesthesia Assessment: ?                          - Prior to the procedure, a History and Physical  ?                          was performed, and patient medications and  ?                          allergies were reviewed. The patient's tolerance of  ?                          previous anesthesia was also reviewed. The risks  ?                          and benefits of the procedure and the sedation  ?                          options and risks were discussed with the patient.  ?                          All questions were answered, and informed consent  ?                          was obtained. Prior Anticoagulants: The patient has  ?                          taken no previous anticoagulant or antiplatelet  ?                          agents. ASA Grade Assessment: II - A patient with  ?                          mild systemic disease. After reviewing the risks  ?                          and benefits, the patient was deemed in  ?  satisfactory condition to undergo the procedure. ?                          After obtaining informed consent, the colonoscope  ?                          was passed under direct vision. Throughout the  ?                          procedure, the patient's blood pressure, pulse, and  ?                          oxygen saturations were monitored continuously. The  ?                          CF [id number] #[id number] was introduced  through the anus  ?                          and advanced to the the cecum, identified by  ?                          appendiceal orifice and ileocecal valve. The  ?                          colonoscopy was performed without difficulty. The  ?                          patient tolerated the procedure well. The quality  ?                          of the bowel preparation was good. The ileocecal  ?                          valve, appendiceal orifice, and rectum were  ?                          photographed. ?Scope In: 7:50:33 AM ?Scope Out: 7:59:58 AM ?Scope Withdrawal Time: 0 hours 7 minutes 14 seconds  ?Total Procedure Duration: 0 hours 9 minutes 25 seconds  ?Findings:                 Two sessile polyps were found in the transverse  ?                          colon and ascending colon. The polyps were 2 to 4  ?                          mm in size. These polyps were removed with a cold  ?                          snare. Resection and retrieval were complete. ?                          Multiple small and large-mouthed diverticula were  ?  found in the left colon. ?                          Internal hemorrhoids were found. The hemorrhoids  ?                          were small. ?                          The exam was otherwise without abnormality on  ?                          direct and retroflexion views. ?Complications:            No immediate complications. Estimated blood loss:  ?                          None. ?Estimated Blood Loss:     Estimated blood loss: none. ?Impression:               - Two 2 to 4 mm polyps in the transverse colon and  ?                          in the ascending colon, removed with a cold snare.  ?                          Resected and retrieved. ?                          - Diverticulosis in the left colon. ?                          - Internal hemorrhoids. ?                          - The examination was otherwise normal on direct  ?                          and  retroflexion views. ?Recommendation:           - Patient has a contact number available for  ?                          emergencies. The signs and symptoms of potential  ?                          delayed complications were discussed with the  ?                          patient. Return to normal activities tomorrow.  ?                          Written discharge instructions were provided to the  ?                          patient. ?                          -  Resume previous diet. ?                          - Continue present medications. ?                          - Await pathology results. ?Milus Banister, MD ?09/13/2021 8:03:07 AM ?This report has been signed electronically. ?

## 2021-09-13 NOTE — Progress Notes (Signed)
VS by CW  Pt's states no medical or surgical changes since previsit or office visit.  

## 2021-09-13 NOTE — Progress Notes (Signed)
To pacu VSS. Report to [email or web address] 

## 2021-09-13 NOTE — Progress Notes (Signed)
HPI: ?This is a woman with FH CC (father, age??) ? ? ?ROS: complete GI ROS as described in HPI, all other review negative. ? ?Constitutional:  No unintentional weight loss ? ? ?Past Medical History:  ?Diagnosis Date  ? Allergy   ? Anxiety   ? Asthma   ? Cancer [facility])   ? skin cancer x 2  ? Depression   ? History of renal stone 2011  ? lithotripsy  ? Hydrocele of testis   ? Hypertension   ? Scoliosis   ? sees chiropractor monthly  ? Scoliosis   ? Thyroid disease   ? ? ?Past Surgical History:  ?Procedure Laterality Date  ? COLONOSCOPY    ? HYDROCELE EXCISION  10/19/2011  ? Procedure: HYDROCELECTOMY ADULT;  Surgeon: Malka So, MD;  Location: [facility];  Service: Urology;  Laterality: Left;  left hydrocelectomy , left epididectomy ?  ? LITHOTRIPSY    ? x2  ? LITHOTRIPSY Right   ? Done twice in 2010  ? MOHS SURGERY    ? VASECTOMY    ? ? ?Current Outpatient Medications  ?Medication Sig Dispense Refill  ? diphenhydrAMINE HCl (BENADRYL PO) Take by mouth.    ? DULoxetine (CYMBALTA) 20 MG capsule Take 1 capsule (20 mg total) by mouth daily. 90 capsule 3  ? hydrOXYzine (VISTARIL) 50 MG capsule Take 1 capsule (50 mg total) by mouth at bedtime as needed. 90 capsule 1  ? levothyroxine (SYNTHROID) 125 MCG tablet Take 1 tablet (125 mcg total) by mouth daily. 90 tablet 1  ? lisinopril (ZESTRIL) 20 MG tablet Take 1 tablet (20 mg total) by mouth daily. 90 tablet 3  ? sildenafil (VIAGRA) 100 MG tablet TAKE ONE TABLET BY MOUTH AS DIRECTED FOR 90 DAYS    ? ?Current Facility-Administered Medications  ?Medication Dose Route Frequency Provider Last Rate Last Admin  ? 0.9 %  sodium chloride infusion  500 mL Intravenous Once Milus Banister, MD      ? ? ?Allergies as of 09/13/2021 - Review Complete 09/13/2021  ?Allergen Reaction Noted  ? Lactose intolerance (gi) Diarrhea and Nausea Only 06/19/2020  ? ? ?Family History  ?Problem Relation Age of Onset  ? Hypertension Mother   ? Heart disease Mother   ? Cancer Mother   ?      bone  ? Rectal cancer Father   ? Stomach cancer Father   ? Colon cancer Father   ? Hypertension Father   ? Cancer Father   ?     bone and colon  ? Kidney failure Father   ? Alcohol abuse Father   ? Anxiety disorder Father   ? Early death Father   ? Colon polyps Neg Hx   ? Crohn's disease Neg Hx   ? Esophageal cancer Neg Hx   ? ? ?Social History  ? ?Socioeconomic History  ? Marital status: Married  ?  Spouse name: Not on file  ? Number of children: Not on file  ? Years of education: Not on file  ? Highest education level: Not on file  ?Occupational History  ? Not on file  ?Tobacco Use  ? Smoking status: Never  ? Smokeless tobacco: Never  ?Vaping Use  ? Vaping Use: Never used  ?Substance and Sexual Activity  ? Alcohol use: Not Currently  ?  Comment: 2/week  ? Drug use: Yes  ?  Types: Marijuana  ?  Comment: Today 08/30/21  ? Sexual activity: Yes  ?  Birth  control/protection: Surgical  ?Other Topics Concern  ? Not on file  ?Social History Narrative  ? Not on file  ? ?Social Determinants of Health  ? ?Financial Resource Strain: Low Risk   ? Difficulty of Paying Living Expenses: Not hard at all  ?Food Insecurity: No Food Insecurity  ? Worried About Charity fundraiser in the Last Year: Never true  ? Ran Out of Food in the Last Year: Never true  ?Transportation Needs: No Transportation Needs  ? Lack of Transportation (Medical): No  ? Lack of Transportation (Non-Medical): No  ?Physical Activity: Inactive  ? Days of Exercise per Week: 0 days  ? Minutes of Exercise per Session: 0 min  ?Stress: No Stress Concern Present  ? Feeling of Stress : Not at all  ?Social Connections: Moderately Isolated  ? Frequency of Communication with Friends and Family: Twice a week  ? Frequency of Social Gatherings with Friends and Family: Twice a week  ? Attends Religious Services: Never  ? Active Member of Clubs or Organizations: No  ? Attends Archivist Meetings: Never  ? Marital Status: Married  ?Intimate Partner Violence: Not At Risk   ? Fear of Current or Ex-Partner: No  ? Emotionally Abused: No  ? Physically Abused: No  ? Sexually Abused: No  ? ? ? ?Physical Exam: ?BP 140/78   Pulse 60   Temp (!) 97.1 ?F (36.2 ?C)   Ht '5\' 8"'$  (1.727 m)   Wt 200 lb (90.7 kg)   SpO2 98%   BMI 30.41 kg/m?  ?Constitutional: generally well-appearing ?Psychiatric: alert and oriented x3 ?Lungs: CTA bilaterally ?Heart: no MCR ? ?Assessment and plan: ?66 y.o. male with FH CRC ? ?Screening colonoscopy today. Last CRC screening was >10 years ago. ? ?Care is appropriate for the ambulatory setting. ? ?Owens Loffler, MD ?[facility] ?09/13/2021, 7:28 AM ? ? ? ?

## 2021-09-15 ENCOUNTER — Telehealth: Payer: Self-pay | Admitting: *Deleted

## 2021-09-15 NOTE — Telephone Encounter (Signed)
?  Follow up Call- ? ? ?  09/13/2021  ?  7:04 AM  ?Call back number  ?Post procedure Call Back phone  # [phone]  ?Permission to leave phone message No  ?  ? ?Patient questions: ? ?Do you have a fever, pain , or abdominal swelling? No. ?Pain Score  0 * ? ?Have you tolerated food without any problems? Yes.   ? ?Have you been able to return to your normal activities? Yes.   ? ?Do you have any questions about your discharge instructions: ?Diet   No. ?Medications  No. ?Follow up visit  No. ? ?Do you have questions or concerns about your Care? No. ? ?Actions: ?* If pain score is 4 or above: ?No action needed, pain <4. ? ? ?

## 2021-09-19 ENCOUNTER — Encounter: Payer: Self-pay | Admitting: Gastroenterology

## 2022-01-18 ENCOUNTER — Encounter: Payer: Self-pay | Admitting: Nurse Practitioner

## 2022-02-02 ENCOUNTER — Encounter: Payer: Self-pay | Admitting: Nurse Practitioner

## 2022-02-02 ENCOUNTER — Ambulatory Visit (INDEPENDENT_AMBULATORY_CARE_PROVIDER_SITE_OTHER): Payer: Medicare Other | Admitting: Nurse Practitioner

## 2022-02-02 VITALS — BP 118/68 | HR 81 | Temp 97.5°F | Ht 68.0 in | Wt 195.0 lb

## 2022-02-02 DIAGNOSIS — E039 Hypothyroidism, unspecified: Secondary | ICD-10-CM

## 2022-02-02 DIAGNOSIS — N521 Erectile dysfunction due to diseases classified elsewhere: Secondary | ICD-10-CM

## 2022-02-02 DIAGNOSIS — I10 Essential (primary) hypertension: Secondary | ICD-10-CM | POA: Diagnosis not present

## 2022-02-02 DIAGNOSIS — F411 Generalized anxiety disorder: Secondary | ICD-10-CM

## 2022-02-02 LAB — TSH: TSH: 0.95 u[IU]/mL (ref 0.35–5.50)

## 2022-02-02 LAB — BASIC METABOLIC PANEL
BUN: 7 mg/dL (ref 6–23)
CO2: 27 mEq/L (ref 19–32)
Calcium: 9.2 mg/dL (ref 8.4–10.5)
Chloride: 103 mEq/L (ref 96–112)
Creatinine, Ser: 0.94 mg/dL (ref 0.40–1.50)
GFR: 83.8 mL/min (ref >60.00)
Glucose, Bld: 114 mg/dL — ABNORMAL HIGH (ref 70–99)
Potassium: 3.8 mEq/L (ref 3.5–5.1)
Sodium: 139 mEq/L (ref 135–145)

## 2022-02-02 LAB — T4, FREE: Free T4: 0.91 ng/dL (ref 0.60–1.60)

## 2022-02-02 MED ORDER — LEVOTHYROXINE SODIUM 125 MCG PO TABS: 125.0000 ug | ORAL_TABLET | Freq: Every day | ORAL | 1 refills | Status: DC

## 2022-02-02 MED ORDER — DULOXETINE HCL 20 MG PO CPEP: 20.0000 mg | ORAL_CAPSULE | Freq: Every day | ORAL | 3 refills | Status: DC

## 2022-02-02 MED ORDER — SILDENAFIL CITRATE 100 MG PO TABS: ORAL_TABLET | ORAL | 2 refills | Status: DC

## 2022-02-02 MED ORDER — LISINOPRIL 20 MG PO TABS: 20.0000 mg | ORAL_TABLET | Freq: Every day | ORAL | 3 refills | Status: DC

## 2022-02-02 NOTE — Assessment & Plan Note (Signed)
viagra refill sent Denies any adverse effects

## 2022-02-02 NOTE — Progress Notes (Signed)
Established Patient Visit  Patient: Nicholas Moody   DOB: Aug 02, 1955   66 y.o. Male  MRN: [id number] Visit Date: 02/02/2022  Subjective:    Chief Complaint  Patient presents with   Offiice Visit    F/u appt, medication refill on Viagra No concerns      HPI Hypertension BP at goal with lisinopril Denies any adverse side effects BP Readings from Last 3 Encounters:  02/02/22 118/68  09/13/21 130/88  05/30/21 124/88   Maintain med dose Check BMP Refill sent  Hypothyroidism Stable Repeat TSH and T4 Maintain med dose while waiting for results  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 male viagra refill sent Denies any adverse effects   Reviewed medical, surgical, and social history today  Medications: Outpatient Medications Prior to Visit  Medication Sig   [DISCONTINUED] DULoxetine (CYMBALTA) 20 MG capsule Take 1 capsule (20 mg total) by mouth daily.   [DISCONTINUED] levothyroxine (SYNTHROID) 125 MCG tablet Take 1 tablet (125 mcg total) by mouth daily.   [DISCONTINUED] lisinopril (ZESTRIL) 20 MG tablet Take 1 tablet (20 mg total) by mouth daily.   [DISCONTINUED] sildenafil (VIAGRA) 100 MG tablet TAKE ONE TABLET BY MOUTH AS DIRECTED FOR 90 DAYS   [DISCONTINUED] diphenhydrAMINE HCl (BENADRYL PO) Take by mouth. (Patient not taking: Reported on 02/02/2022)   [DISCONTINUED] hydrOXYzine (VISTARIL) 50 MG capsule Take 1 capsule (50 mg total) by mouth at bedtime as needed. (Patient not taking: Reported on 02/02/2022)   No facility-administered medications prior to visit.   Reviewed past medical and social history.   ROS per HPI above      Objective:  BP 118/68 (BP Location: Right Arm, Patient Position: Sitting, Cuff Size: Small)   Pulse 81   Temp (!) 97.5 F (36.4 C) (Temporal)   Ht '5\' 8"'$  (1.727 m)   Wt 195 lb (88.5 kg)   SpO2 98%   BMI 29.65 kg/m      Physical Exam Vitals reviewed.  Cardiovascular:     Rate and Rhythm: Normal rate and  regular rhythm.     Pulses: Normal pulses.     Heart sounds: Normal heart sounds.  Pulmonary:     Effort: Pulmonary effort is normal.     Breath sounds: Normal breath sounds.  Musculoskeletal:     Right lower leg: No edema.     Left lower leg: No edema.  Neurological:     Mental Status: He is alert and oriented to person, place, and time.  Psychiatric:        Mood and Affect: Mood normal.        Behavior: Behavior normal.        Thought Content: Thought content normal.     No results found for any visits on 02/02/22.    Assessment & Plan:    Problem List Items Addressed This Visit       Cardiovascular and Mediastinum   Hypertension - Primary    BP at goal with lisinopril Denies any adverse side effects BP Readings from Last 3 Encounters:  02/02/22 118/68  09/13/21 130/88  05/30/21 124/88   Maintain med dose Check BMP Refill sent      Relevant Medications   sildenafil (VIAGRA) 100 MG tablet   lisinopril (ZESTRIL) 20 MG tablet   Other Relevant Orders   Basic metabolic panel     Endocrine   Hypothyroidism    Stable Repeat TSH and T4  Maintain med dose while waiting for results      Relevant Medications   levothyroxine (SYNTHROID) 125 MCG tablet   Other Relevant Orders   TSH   T4, free     Other   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 male    viagra refill sent Denies any adverse effects      Relevant Medications   sildenafil (VIAGRA) 100 MG tablet   GAD (generalized anxiety disorder)   Relevant Medications   DULoxetine (CYMBALTA) 20 MG capsule   Return in about 6 months (around 08/05/2022) for Hypothyroidism, HTN, hyperlipidemia (fasting).     Wilfred Lacy, NP

## 2022-02-02 NOTE — Assessment & Plan Note (Signed)
Stable Repeat TSH and T4 Maintain med dose while waiting for results

## 2022-02-02 NOTE — Assessment & Plan Note (Signed)
BP at goal with lisinopril Denies any adverse side effects BP Readings from Last 3 Encounters:  02/02/22 118/68  09/13/21 130/88  05/30/21 124/88   Maintain med dose Check BMP Refill sent

## 2022-02-02 NOTE — Patient Instructions (Signed)
Go to lab Maintain current med dose

## 2022-02-03 ENCOUNTER — Other Ambulatory Visit: Payer: Medicare Other

## 2022-05-14 LAB — NOVEL CORONAVIRUS, NAA

## 2022-06-04 ENCOUNTER — Other Ambulatory Visit: Payer: Self-pay | Admitting: Nurse Practitioner

## 2022-06-04 DIAGNOSIS — N521 Erectile dysfunction due to diseases classified elsewhere: Secondary | ICD-10-CM

## 2022-06-05 NOTE — Telephone Encounter (Signed)
Chart supports Rx Last OV: 01/2022 Next OV: 07/2022

## 2022-07-17 ENCOUNTER — Ambulatory Visit (INDEPENDENT_AMBULATORY_CARE_PROVIDER_SITE_OTHER): Payer: Medicare Other

## 2022-07-17 VITALS — Ht 66.0 in | Wt 200.0 lb

## 2022-07-17 DIAGNOSIS — Z0000 Encounter for general adult medical examination without abnormal findings: Secondary | ICD-10-CM | POA: Diagnosis not present

## 2022-07-17 NOTE — Patient Instructions (Signed)
Nicholas Moody , Thank you for taking time to come for your Medicare Wellness Visit. I appreciate your ongoing commitment to your health goals. Please review the following plan we discussed and let me know if I can assist you in the future.   These are the goals we discussed:  Goals      Patient Stated     07/17/2022, no goals        This is a list of the screening recommended for you and due dates:  Health Maintenance  Topic Date Due   DTaP/Tdap/Td vaccine (2 - Tdap) 03/21/2020   Flu Shot  01/17/2022   COVID-19 Vaccine (7 - 2023-24 season) 07/11/2022   Hepatitis C Screening: USPSTF Recommendation to screen - Ages 18-79 yo.  02/03/2023*   Medicare Annual Wellness Visit  07/18/2023   Colon Cancer Screening  09/14/2026   Pneumonia Vaccine  Completed   Zoster (Shingles) Vaccine  Completed   HPV Vaccine  Aged Out  *Topic was postponed. The date shown is not the original due date.    Advanced directives: copy in chart  Conditions/risks identified: none  Next appointment: Follow up in one year for your annual wellness visit.   Preventive Care 69 Years and Older, Male  Preventive care refers to lifestyle choices and visits with your health care provider that can promote health and wellness. What does preventive care include? A yearly physical exam. This is also called an annual well check. Dental exams once or twice a year. Routine eye exams. Ask your health care provider how often you should have your eyes checked. Personal lifestyle choices, including: Daily care of your teeth and gums. Regular physical activity. Eating a healthy diet. Avoiding tobacco and drug use. Limiting alcohol use. Practicing safe sex. Taking low doses of aspirin every day. Taking vitamin and mineral supplements as recommended by your health care provider. What happens during an annual well check? The services and screenings done by your health care provider during your annual well check will depend on  your age, overall health, lifestyle risk factors, and family history of disease. Counseling  Your health care provider may ask you questions about your: Alcohol use. Tobacco use. Drug use. Emotional well-being. Home and relationship well-being. Sexual activity. Eating habits. History of falls. Memory and ability to understand (cognition). Work and work Statistician. Screening  You may have the following tests or measurements: Height, weight, and BMI. Blood pressure. Lipid and cholesterol levels. These may be checked every 5 years, or more frequently if you are over 42 years old. Skin check. Lung cancer screening. You may have this screening every year starting at age 69 if you have a 30-pack-year history of smoking and currently smoke or have quit within the past 69 years. Fecal occult blood test (FOBT) of the stool. You may have this test every year starting at age 69. Flexible sigmoidoscopy or colonoscopy. You may have a sigmoidoscopy every 5 years or a colonoscopy every 10 years starting at age 69. Prostate cancer screening. Recommendations will vary depending on your family history and other risks. Hepatitis C blood test. Hepatitis B blood test. Sexually transmitted disease (STD) testing. Diabetes screening. This is done by checking your blood sugar (glucose) after you have not eaten for a while (fasting). You may have this done every 1-3 years. Abdominal aortic aneurysm (AAA) screening. You may need this if you are a current or former smoker. Osteoporosis. You may be screened starting at age 70 if you are at high  risk. Talk with your health care provider about your test results, treatment options, and if necessary, the need for more tests. Vaccines  Your health care provider may recommend certain vaccines, such as: Influenza vaccine. This is recommended every year. Tetanus, diphtheria, and acellular pertussis (Tdap, Td) vaccine. You may need a Td booster every 10 years. Zoster  vaccine. You may need this after age 101. Pneumococcal 13-valent conjugate (PCV13) vaccine. One dose is recommended after age 65. Pneumococcal polysaccharide (PPSV23) vaccine. One dose is recommended after age 69. Talk to your health care provider about which screenings and vaccines you need and how often you need them. This information is not intended to replace advice given to you by your health care provider. Make sure you discuss any questions you have with your health care provider. Document Released: 07/02/2015 Document Revised: 02/23/2016 Document Reviewed: 04/06/2015 Elsevier Interactive Patient Education  2017 [place] Prevention in the Home Falls can cause injuries. They can happen to people of all ages. There are many things you can do to make your home safe and to help prevent falls. What can I do on the outside of my home? Regularly fix the edges of walkways and driveways and fix any cracks. Remove anything that might make you trip as you walk through a door, such as a raised step or threshold. Trim any bushes or trees on the path to your home. Use bright outdoor lighting. Clear any walking paths of anything that might make someone trip, such as rocks or tools. Regularly check to see if handrails are loose or broken. Make sure that both sides of any steps have handrails. Any raised decks and porches should have guardrails on the edges. Have any leaves, snow, or ice cleared regularly. Use sand or salt on walking paths during winter. Clean up any spills in your garage right away. This includes oil or grease spills. What can I do in the bathroom? Use night lights. Install grab bars by the toilet and in the tub and shower. Do not use towel bars as grab bars. Use non-skid mats or decals in the tub or shower. If you need to sit down in the shower, use a plastic, non-slip stool. Keep the floor dry. Clean up any water that spills on the floor as soon as it happens. Remove  soap buildup in the tub or shower regularly. Attach bath mats securely with double-sided non-slip rug tape. Do not have throw rugs and other things on the floor that can make you trip. What can I do in the bedroom? Use night lights. Make sure that you have a light by your bed that is easy to reach. Do not use any sheets or blankets that are too big for your bed. They should not hang down onto the floor. Have a firm chair that has side arms. You can use this for support while you get dressed. Do not have throw rugs and other things on the floor that can make you trip. What can I do in the kitchen? Clean up any spills right away. Avoid walking on wet floors. Keep items that you use a lot in easy-to-reach places. If you need to reach something above you, use a strong step stool that has a grab bar. Keep electrical cords out of the way. Do not use floor polish or wax that makes floors slippery. If you must use wax, use non-skid floor wax. Do not have throw rugs and other things on the floor that can  make you trip. What can I do with my stairs? Do not leave any items on the stairs. Make sure that there are handrails on both sides of the stairs and use them. Fix handrails that are broken or loose. Make sure that handrails are as long as the stairways. Check any carpeting to make sure that it is firmly attached to the stairs. Fix any carpet that is loose or worn. Avoid having throw rugs at the top or bottom of the stairs. If you do have throw rugs, attach them to the floor with carpet tape. Make sure that you have a light switch at the top of the stairs and the bottom of the stairs. If you do not have them, ask someone to add them for you. What else can I do to help prevent falls? Wear shoes that: Do not have high heels. Have rubber bottoms. Are comfortable and fit you well. Are closed at the toe. Do not wear sandals. If you use a stepladder: Make sure that it is fully opened. Do not climb a  closed stepladder. Make sure that both sides of the stepladder are locked into place. Ask someone to hold it for you, if possible. Clearly mark and make sure that you can see: Any grab bars or handrails. First and last steps. Where the edge of each step is. Use tools that help you move around (mobility aids) if they are needed. These include: Canes. Walkers. Scooters. Crutches. Turn on the lights when you go into a dark area. Replace any light bulbs as soon as they burn out. Set up your furniture so you have a clear path. Avoid moving your furniture around. If any of your floors are uneven, fix them. If there are any pets around you, be aware of where they are. Review your medicines with your doctor. Some medicines can make you feel dizzy. This can increase your chance of falling. Ask your doctor what other things that you can do to help prevent falls. This information is not intended to replace advice given to you by your health care provider. Make sure you discuss any questions you have with your health care provider. Document Released: 04/01/2009 Document Revised: 11/11/2015 Document Reviewed: 07/10/2014 Elsevier Interactive Patient Education  2017 [employer].

## 2022-07-17 NOTE — Progress Notes (Signed)
I connected with [name] today by telephone and verified that I am speaking with the correct person using two identifiers. Location patient: home Location provider: work Persons participating in the virtual visit: Nicholas Moody, Glenna Durand LPN.   I discussed the limitations, risks, security and privacy concerns of performing an evaluation and management service by telephone and the availability of in person appointments. I also discussed with the patient that there may be a patient responsible charge related to this service. The patient expressed understanding and verbally consented to this telephonic visit.    Interactive audio and video telecommunications were attempted between this provider and patient, however failed, due to patient having technical difficulties OR patient did not have access to video capability.  We continued and completed visit with audio only.     Vital signs may be patient reported or missing.  Subjective:   Nicholas Moody is a 67 y.o. male who presents for Medicare Annual/Subsequent preventive examination.  Review of Systems     Cardiac Risk Factors include: advanced age (>61mn, >>9women);hypertension;male gender;obesity (BMI >30kg/m2)     Objective:    Today's Vitals   07/17/22 0811  Weight: 200 lb (90.7 kg)  Height: '5\' 6"'$  (1.676 m)   Body mass index is 32.28 kg/m.     07/17/2022    8:14 AM 07/13/2021    9:53 AM 05/30/2021    8:34 AM 10/19/2011   11:02 AM 10/16/2011    4:19 PM  Advanced Directives  Does Patient Have a Medical Advance Directive? Yes Yes Yes Patient has advance directive, copy not in chart Patient has advance directive, copy not in chart  Type of Advance Directive [place]restLiving will [place]Living will H[place] Ranche[place]Living will;Out of facility DNR (pink MOST or yellow form) HR[place]manLiving will HHanging Rockin Chart? Yes - validated most recent copy scanned in chart (See row information) No - copy requested Yes - validated most recent copy scanned in chart (See row information)    Pre-existing out of facility DNR order (yellow form or pink MOST form)   Pink MOST/Yellow Form most recent copy in chart - Physician notified to receive inpatient order No     Current Medications (verified) Outpatient Encounter Medications as of 07/17/2022  Medication Sig   DULoxetine (CYMBALTA) 20 MG capsule Take 1 capsule (20 mg total) by mouth daily.   levothyroxine (SYNTHROID) 125 MCG tablet Take 1 tablet (125 mcg total) by mouth daily.   lisinopril (ZESTRIL) 20 MG tablet Take 1 tablet (20 mg total) by mouth daily.   sildenafil (VIAGRA) 100 MG tablet TAKE ONE TABLET BY MOUTH ONE TIME DAILY AS NEEDED   No facility-administered encounter medications on file as of 07/17/2022.    Allergies (verified) Lactose intolerance (gi)   History: Past Medical History:  Diagnosis Date   Advanced care planning/counseling discussion 05/30/2021   Allergy    Anxiety    Asthma    Cancer ([place]    skin cancer x 2   Depression    History of renal stone 2011   lithotripsy   Hydrocele of testis    Hypertension    Scoliosis    sees chiropractor monthly   Scoliosis    Thyroid disease    Past Surgical History:  Procedure Laterality Date   COLONOSCOPY     HYDROCELE EXCISION  10/19/2011   Procedure: HYDROCELECTOMY ADULT;  Surgeon: JJenny Reichmann  Keene Breath, MD;  Location: [facility];  Service: Urology;  Laterality: Left;  left hydrocelectomy , left epididectomy    LITHOTRIPSY     x2   LITHOTRIPSY Right    Done twice in 2010   Castor     Family History  Problem Relation Age of Onset   Hypertension Mother    Heart disease Mother    Cancer Mother        bone   Rectal cancer Father    Stomach cancer Father    Colon cancer Father    Hypertension Father    Cancer Father        bone  and colon   Kidney failure Father    Alcohol abuse Father    Anxiety disorder Father    Early death Father    Colon polyps Neg Hx    Crohn's disease Neg Hx    Esophageal cancer Neg Hx    Social History   Socioeconomic History   Marital status: Married    Spouse name: Not on file   Number of children: Not on file   Years of education: Not on file   Highest education level: Not on file  Occupational History   Not on file  Tobacco Use   Smoking status: Never   Smokeless tobacco: Never  Vaping Use   Vaping Use: Never used  Substance and Sexual Activity   Alcohol use: Not Currently    Comment: 2/week   Drug use: Yes    Types: Marijuana    Comment: Today 08/30/21   Sexual activity: Yes    Birth control/protection: Surgical  Other Topics Concern   Not on file  Social History Narrative   Not on file   Social Determinants of Health   Financial Resource Strain: Low Risk  (07/17/2022)   Overall Financial Resource Strain (CARDIA)    Difficulty of Paying Living Expenses: Not hard at all  Food Insecurity: No Food Insecurity (07/17/2022)   Hunger Vital Sign    Worried About Running Out of Food in the Last Year: Never true    Ran Out of Food in the Last Year: Never true  Transportation Needs: No Transportation Needs (07/17/2022)   PRAPARE - Hydrologist (Medical): No    Lack of Transportation (Non-Medical): No  Physical Activity: Inactive (07/17/2022)   Exercise Vital Sign    Days of Exercise per Week: 0 days    Minutes of Exercise per Session: 0 min  Stress: No Stress Concern Present (07/17/2022)   [place]    Feeling of Stress : Not at all  Social Connections: Moderately Isolated (07/13/2021)   Social Connection and Isolation Panel [NHANES]    Frequency of Communication with Friends and Family: Twice a week    Frequency of Social Gatherings with Friends and Family: Twice a week     Attends Religious Services: Never    Marine scientist or Organizations: No    Attends Music therapist: Never    Marital Status: Married    Tobacco Counseling Counseling given: Not Answered   Clinical Intake:  Pre-visit preparation completed: Yes  Pain : No/denies pain     Nutritional Status: BMI > 30  Obese Nutritional Risks: None Diabetes: No  How often do you need to have someone help you when you read instructions, pamphlets, or other written materials from your doctor  or pharmacy?: 1 - Never  Diabetic? no  Interpreter Needed?: No  Information entered by :: NAllen LPN   Activities of Daily Living    07/17/2022    8:15 AM  In your present state of health, do you have any difficulty performing the following activities:  Hearing? 1  Vision? 0  Difficulty concentrating or making decisions? 0  Walking or climbing stairs? 0  Dressing or bathing? 0  Doing errands, shopping? 0  Preparing Food and eating ? N  Using the Toilet? N  In the past six months, have you accidently leaked urine? N  Do you have problems with loss of bowel control? N  Managing your Medications? N  Managing your Finances? N  Housekeeping or managing your Housekeeping? N    Patient Care Team: Nche, Charlene Brooke, NP as PCP - General (Internal Medicine)  Indicate any recent Medical Services you may have received from other than [facility] providers in the past year (date may be approximate).     Assessment:   This is a routine wellness examination for Torres.  Hearing/Vision screen Vision Screening - Comments:: No regular eye exams, [facility]  Dietary issues and exercise activities discussed: Current Exercise Habits: The patient does not participate in regular exercise at present (works outside)   Goals Addressed             This [place]    Patient Stated       07/17/2022, no goals       Depression Screen    07/17/2022    8:15 AM 07/13/2021    9:55  AM 07/13/2021    9:51 AM 05/30/2021   10:01 AM 04/14/2021   12:35 PM 08/25/2020    2:48 PM 04/02/2020    8:14 AM  PHQ 2/9 Scores  PHQ - 2 Score 0 0 0 0 0 0 0  PHQ- 9 Score    1 6      Fall Risk    07/17/2022    8:15 AM 07/13/2021    9:54 AM 05/30/2021    8:00 AM 04/14/2021   10:36 AM 08/25/2020    2:49 PM  Fall Risk   Falls in the past year? 0 0 0 0 0  Number falls in past yr: 0 0 0 0   Injury with Fall? 0 0 0 0   Risk for fall due to :  No Fall Risks No Fall Risks No Fall Risks   Follow up Falls prevention discussed;Education provided;Falls evaluation completed Falls evaluation completed Falls evaluation completed Falls evaluation completed     FALL RISK PREVENTION PERTAINING TO THE HOME:  Any stairs in or around the home? Yes  If so, are there any without handrails? No  Home free of loose throw rugs in walkways, pet beds, electrical cords, etc? Yes  Adequate lighting in your home to reduce risk of falls? Yes   ASSISTIVE DEVICES UTILIZED TO PREVENT FALLS:  Life alert? No  Use of a cane, walker or w/c? No  Grab bars in the bathroom? No  Shower chair or bench in shower? No  Elevated toilet seat or a handicapped toilet? No   TIMED UP AND GO:  Was the test performed? No .      Cognitive Function:        07/17/2022    8:16 AM  6CIT Screen  What Year? 0 points  What month? 0 points  What time? 0 points  Count back from 20 0 points  Months in reverse 0 points  Repeat phrase 2 points  Total Score 2 points    Immunizations Immunization History  Administered Date(s) Administered   Fluad Quad(high Dose 65+) 04/02/2020, 03/23/2021   Influenza Whole 03/21/2010   Influenza-Unspecified 03/28/2021   Moderna Sars-Covid-2 Vaccination 08/20/2019, 09/17/2019, 04/20/2020, 01/05/2021, 03/28/2021   Novavax(Covid-19) Vaccine 05/16/2022   PNEUMOCOCCAL CONJUGATE-20 09/27/2020   Td 03/21/2010   Zoster Recombinat (Shingrix) 12/28/2020, 03/28/2021    TDAP status: Up to  date  Flu Vaccine status: Up to date  Pneumococcal vaccine status: Up to date  Covid-19 vaccine status: Completed vaccines  Qualifies for Shingles Vaccine? Yes   Zostavax completed Yes   Shingrix Completed?: Yes  Screening Tests Health Maintenance  Topic Date Due   DTaP/Tdap/Td (2 - Tdap) 03/21/2020   INFLUENZA VACCINE  01/17/2022   COVID-19 Vaccine (7 - 2023-24 season) 07/11/2022   Medicare Annual Wellness (AWV)  07/13/2022   Hepatitis C Screening  02/03/2023 (Originally 06/12/1974)   COLONOSCOPY (Pts 45-20yr Insurance coverage will need to be confirmed)  09/14/2026   Pneumonia Vaccine 67 Years old  Completed   Zoster Vaccines- Shingrix  Completed   HPV VACCINES  Aged Out    Health Maintenance  Health Maintenance Due  Topic Date Due   DTaP/Tdap/Td (2 - Tdap) 03/21/2020   INFLUENZA VACCINE  01/17/2022   COVID-19 Vaccine (7 - 2023-24 season) 07/11/2022   Medicare Annual Wellness (AWV)  07/13/2022    Colorectal cancer screening: Type of screening: Colonoscopy. Completed 09/13/2021. Repeat every 5 years  Lung Cancer Screening: (Low Dose CT Chest recommended if Age 766-80years, 30 pack-year currently smoking OR have quit w/in 15years.) does not qualify.   Lung Cancer Screening Referral: no  Additional Screening:  Hepatitis C Screening: does qualify;   Vision Screening: Recommended annual ophthalmology exams for early detection of glaucoma and other disorders of the eye. Is the patient up to date with their annual eye exam?  No  Who is the provider or what is the name of the office in which the patient attends annual eye exams? [facility] If pt is not established with a provider, would they like to be referred to a provider to establish care? No .   Dental Screening: Recommended annual dental exams for proper oral hygiene  Community Resource Referral / Chronic Care Management: CRR required this visit?  No   CCM required this visit?  No      Plan:     I  have personally reviewed and noted the following in the patient's chart:   Medical and social history Use of alcohol, tobacco or illicit drugs  Current medications and supplements including opioid prescriptions. Patient is not currently taking opioid prescriptions. Functional ability and status Nutritional status Physical activity Advanced directives List of other physicians Hospitalizations, surgeries, and ER visits in previous 12 months Vitals Screenings to include cognitive, depression, and falls Referrals and appointments  In addition, I have reviewed and discussed with patient certain preventive protocols, quality metrics, and best practice recommendations. A written personalized care plan for preventive services as well as general preventive health recommendations were provided to patient.     NKellie Simmering LPN   17/09/8887  Nurse Notes: none  Due to this being a virtual visit, the after visit summary with patients personalized plan was offered to patient via mail or my-chart.  Patient would like to access on my-chart

## 2022-08-07 ENCOUNTER — Ambulatory Visit (INDEPENDENT_AMBULATORY_CARE_PROVIDER_SITE_OTHER): Payer: Medicare Other | Admitting: Nurse Practitioner

## 2022-08-07 ENCOUNTER — Encounter: Payer: Self-pay | Admitting: Nurse Practitioner

## 2022-08-07 VITALS — BP 140/80 | HR 56 | Temp 98.4°F | Resp 16 | Ht 68.0 in | Wt 187.6 lb

## 2022-08-07 DIAGNOSIS — E039 Hypothyroidism, unspecified: Secondary | ICD-10-CM

## 2022-08-07 DIAGNOSIS — E7800 Pure hypercholesterolemia, unspecified: Secondary | ICD-10-CM | POA: Diagnosis not present

## 2022-08-07 DIAGNOSIS — I10 Essential (primary) hypertension: Secondary | ICD-10-CM

## 2022-08-07 DIAGNOSIS — H903 Sensorineural hearing loss, bilateral: Secondary | ICD-10-CM

## 2022-08-07 DIAGNOSIS — E781 Pure hyperglyceridemia: Secondary | ICD-10-CM

## 2022-08-07 HISTORY — DX: Pure hypercholesterolemia, unspecified: E78.00

## 2022-08-07 LAB — LIPID PANEL
Cholesterol: 172 mg/dL (ref 0–200)
HDL: 45.3 mg/dL (ref >39.00)
LDL Cholesterol: 94 mg/dL (ref 0–99)
NonHDL: 126.9
Total CHOL/HDL Ratio: 4
Triglycerides: 163 mg/dL — ABNORMAL HIGH (ref 0.0–149.0)
VLDL: 32.6 mg/dL (ref 0.0–40.0)

## 2022-08-07 LAB — BASIC METABOLIC PANEL
BUN: 11 mg/dL (ref 6–23)
CO2: 28 mEq/L (ref 19–32)
Calcium: 9.7 mg/dL (ref 8.4–10.5)
Chloride: 102 mEq/L (ref 96–112)
Creatinine, Ser: 0.97 mg/dL (ref 0.40–1.50)
GFR: 80.41 mL/min (ref >60.00)
Glucose, Bld: 91 mg/dL (ref 70–99)
Potassium: 4 mEq/L (ref 3.5–5.1)
Sodium: 140 mEq/L (ref 135–145)

## 2022-08-07 LAB — T4, FREE: Free T4: 0.92 ng/dL (ref 0.60–1.60)

## 2022-08-07 LAB — TSH: TSH: 0.83 u[IU]/mL (ref 0.35–5.50)

## 2022-08-07 NOTE — Patient Instructions (Signed)
Go to lab Continue Heart healthy diet and daily exercise. Maintain current medications. Monitor BP at home, 3x/week in AM Send BP readings via mychart in 1week.

## 2022-08-07 NOTE — Assessment & Plan Note (Signed)
Repeat lipid panel: improving

## 2022-08-07 NOTE — Assessment & Plan Note (Addendum)
Elevated BP today Reports compliance with diet and lisinopril dose Asymptomatic Denies any OTC meds BP Readings from Last 3 Encounters:  08/07/22 (!) 140/80  02/02/22 118/68  09/13/21 130/88    Advised to monitor BP at home and send via mychart in 1week Repeat BMP: normal Maintain current med dose

## 2022-08-07 NOTE — Progress Notes (Addendum)
Stable Follow instructions as discussed during office visit.

## 2022-08-07 NOTE — Progress Notes (Signed)
Established Patient Visit  Patient: Nicholas Moody   DOB: [birth year]   67 y.o. Male  MRN: [id number] Visit Date: 08/07/2022  Subjective:    Chief Complaint  Patient presents with   Medical Management of Chronic Issues    HTN & Thyroid   HPI Hypertension Elevated BP today Reports compliance with diet and lisinopril dose Asymptomatic Denies any OTC meds BP Readings from Last 3 Encounters:  08/07/22 (!) 140/80  02/02/22 118/68  09/13/21 130/88    Advised to monitor BP at home and send via mychart in 1week Maintain current med dose  Hypothyroidism Repeat Tsh and T4 Maintain Levothyroxine dose  BP Readings from Last 3 Encounters:  08/07/22 (!) 140/80  02/02/22 118/68  09/13/21 130/88    Reviewed medical, surgical, and social history today  Medications: Outpatient Medications Prior to Visit  Medication Sig   DULoxetine (CYMBALTA) 20 MG capsule Take 1 capsule (20 mg total) by mouth daily.   lisinopril (ZESTRIL) 20 MG tablet Take 1 tablet (20 mg total) by mouth daily.   sildenafil (VIAGRA) 100 MG tablet TAKE ONE TABLET BY MOUTH ONE TIME DAILY AS NEEDED   [DISCONTINUED] levothyroxine (SYNTHROID) 125 MCG tablet Take 1 tablet (125 mcg total) by mouth daily.   levothyroxine (SYNTHROID) 125 MCG tablet Take 1tab daily Mon-Sat in AM   No facility-administered medications prior to visit.   Reviewed past medical and social history.   ROS per HPI above      Objective:  BP (!) 140/80   Pulse (!) 56   Temp 98.4 F (36.9 C) (Temporal)   Resp 16   Ht 5' 8"$  (1.727 m)   Wt 187 lb 9.6 oz (85.1 kg)   SpO2 98%   BMI 28.52 kg/m      Physical Exam Vitals reviewed.  HENT:     Right Ear: Tympanic membrane, ear canal and external ear normal. Decreased hearing noted.     Left Ear: Tympanic membrane, ear canal and external ear normal. Decreased hearing noted.     Ears:     Weber exam findings: Lateralizes right.    Right Rinne: AC > BC.    Left Rinne: AC >  BC. Cardiovascular:     Rate and Rhythm: Normal rate and regular rhythm.     Pulses: Normal pulses.     Heart sounds: Normal heart sounds.  Pulmonary:     Effort: Pulmonary effort is normal.     Breath sounds: Normal breath sounds.  Musculoskeletal:     Right lower leg: No edema.     Left lower leg: No edema.  Neurological:     Mental Status: He is alert and oriented to person, place, and time.     Results for orders placed or performed in visit on 08/07/22  Novel Coronavirus, NAA ([facility])   Specimen: Nasopharyngeal(NP) swabs in vial transport medium  Result Value Ref Range   SARS-CoV-2, NAA Novavax Covid 2023-24 VL   Results for orders placed or performed in visit on [id number]  Basic metabolic panel  Result Value Ref Range   Sodium 140 135 - 145 mEq/L   Potassium 4.0 3.5 - 5.1 mEq/L   Chloride 102 96 - 112 mEq/L   CO2 28 19 - 32 mEq/L   Glucose, Bld 91 70 - 99 mg/dL   BUN 11 6 - 23 mg/dL   Creatinine, Ser 0.97 0.40 - 1.50 mg/dL   GFR  80.41 >60.00 mL/min   Calcium 9.7 8.4 - 10.5 mg/dL  TSH  Result Value Ref Range   TSH 0.83 0.35 - 5.50 uIU/mL  T4, free  Result Value Ref Range   Free T4 0.92 0.60 - 1.60 ng/dL  Lipid panel  Result Value Ref Range   Cholesterol 172 0 - 200 mg/dL   Triglycerides 163.0 (H) 0.0 - 149.0 mg/dL   HDL 45.30 >39.00 mg/dL   VLDL 32.6 0.0 - 40.0 mg/dL   LDL Cholesterol 94 0 - 99 mg/dL   Total CHOL/HDL Ratio 4    NonHDL 126.90       Assessment & Plan:    Problem List Items Addressed This Visit       Cardiovascular and Mediastinum   Hypertension - Primary    Elevated BP today Reports compliance with diet and lisinopril dose Asymptomatic Denies any OTC meds BP Readings from Last 3 Encounters:  08/07/22 (!) 140/80  02/02/22 118/68  09/13/21 130/88    Advised to monitor BP at home and send via mychart in 1week Maintain current med dose      Relevant Orders   Basic metabolic panel (Completed)     Endocrine   Hypothyroidism     Repeat Tsh and T4 Maintain Levothyroxine dose      Relevant Medications   levothyroxine (SYNTHROID) 125 MCG tablet   Other Relevant Orders   TSH (Completed)   T4, free (Completed)   Other Visit Diagnoses     Sensorineural hearing loss (SNHL) of both ears       Relevant Orders   Ambulatory referral to Audiology   Hypercholesteremia       Relevant Orders   Lipid panel (Completed)      Return in about 4 weeks (around 09/04/2022) for HTN.     Wilfred Lacy, NP

## 2022-08-07 NOTE — Assessment & Plan Note (Addendum)
Repeat Tsh and T4: normal Maintain Levothyroxine dose

## 2022-08-22 ENCOUNTER — Encounter: Payer: Self-pay | Admitting: Nurse Practitioner

## 2022-09-05 ENCOUNTER — Ambulatory Visit (INDEPENDENT_AMBULATORY_CARE_PROVIDER_SITE_OTHER): Payer: Medicare Other | Admitting: Nurse Practitioner

## 2022-09-05 ENCOUNTER — Encounter: Payer: Self-pay | Admitting: Nurse Practitioner

## 2022-09-05 VITALS — BP 120/78 | HR 58 | Temp 98.2°F | Resp 16 | Ht 68.0 in | Wt 186.0 lb

## 2022-09-05 DIAGNOSIS — I10 Essential (primary) hypertension: Secondary | ICD-10-CM | POA: Diagnosis not present

## 2022-09-05 NOTE — Progress Notes (Signed)
                Established Patient Visit  Patient: Nicholas Moody   DOB: [birth year]   67 y.o. Male  MRN: [id number] Visit Date: 09/05/2022  Subjective:    Chief Complaint  Patient presents with   Hypertension   Hypertension BP at goal with lisinopril BP Readings from Last 3 Encounters:  09/05/22 120/78  08/07/22 (!) 140/80  02/02/22 118/68    Maintain med dose F/up in 9months  Sensorineural hearing loss (SNHL) of both ears Evaluated by [facility] Bilateral Hearing aids recommended   Reviewed medical, surgical, and social history today  Medications: Outpatient Medications Prior to Visit  Medication Sig   DULoxetine (CYMBALTA) 20 MG capsule Take 1 capsule (20 mg total) by mouth daily.   levothyroxine (SYNTHROID) 125 MCG tablet Take 1tab daily Mon-Sat in AM   lisinopril (ZESTRIL) 20 MG tablet Take 1 tablet (20 mg total) by mouth daily.   sildenafil (VIAGRA) 100 MG tablet TAKE ONE TABLET BY MOUTH ONE TIME DAILY AS NEEDED   No facility-administered medications prior to visit.   Reviewed past medical and social history.   ROS per HPI above  Last metabolic panel Lab Results  Component Value Date   GLUCOSE 91 08/07/2022   NA 140 08/07/2022   K 4.0 08/07/2022   CL 102 08/07/2022   CO2 28 08/07/2022   BUN 11 08/07/2022   CREATININE 0.97 08/07/2022   CALCIUM 9.7 08/07/2022   PROT 7.0 04/14/2021   ALBUMIN 4.6 04/14/2021   BILITOT 0.5 04/14/2021   ALKPHOS 71 04/14/2021   AST 21 04/14/2021   ALT 16 04/14/2021      Objective:  BP 120/78   Pulse (!) 58   Temp 98.2 F (36.8 C) (Temporal)   Resp 16   Ht 5\' 8"  (1.727 m)   Wt 186 lb (84.4 kg)   SpO2 98%   BMI 28.28 kg/m      Physical Exam Cardiovascular:     Rate and Rhythm: Normal rate.     Pulses: Normal pulses.  Pulmonary:     Effort: Pulmonary effort is normal.  Neurological:     Mental Status: He is alert and oriented to person, place, and time.     No results found for any visits on  09/05/22.    Assessment & Plan:    Problem List Items Addressed This Visit       Cardiovascular and Mediastinum   Hypertension - Primary    BP at goal with lisinopril BP Readings from Last 3 Encounters:  09/05/22 120/78  08/07/22 (!) 140/80  02/02/22 118/68    Maintain med dose F/up in 37months      Return in about 6 months (around 03/08/2023) for HTN, Hypothyroidism, hyperlipidemia (fasting).     Wilfred Lacy, NP

## 2022-09-05 NOTE — Assessment & Plan Note (Signed)
Evaluated by [facility] Bilateral Hearing aids recommended

## 2022-09-05 NOTE — Assessment & Plan Note (Signed)
BP at goal with lisinopril BP Readings from Last 3 Encounters:  09/05/22 120/78  08/07/22 (!) 140/80  02/02/22 118/68    Maintain med dose F/up in 37months

## 2022-09-05 NOTE — Patient Instructions (Signed)
Maintain current med doses

## 2022-10-01 ENCOUNTER — Other Ambulatory Visit: Payer: Self-pay | Admitting: Nurse Practitioner

## 2022-10-01 DIAGNOSIS — E039 Hypothyroidism, unspecified: Secondary | ICD-10-CM

## 2022-12-16 ENCOUNTER — Other Ambulatory Visit: Payer: Self-pay | Admitting: Nurse Practitioner

## 2022-12-16 DIAGNOSIS — I10 Essential (primary) hypertension: Secondary | ICD-10-CM

## 2023-02-01 ENCOUNTER — Encounter (INDEPENDENT_AMBULATORY_CARE_PROVIDER_SITE_OTHER): Payer: Self-pay

## 2023-03-08 ENCOUNTER — Encounter: Payer: Self-pay | Admitting: Nurse Practitioner

## 2023-03-08 ENCOUNTER — Ambulatory Visit (INDEPENDENT_AMBULATORY_CARE_PROVIDER_SITE_OTHER): Payer: Medicare Other | Admitting: Nurse Practitioner

## 2023-03-08 VITALS — BP 126/74 | HR 72 | Temp 98.7°F | Ht 68.0 in | Wt 181.0 lb

## 2023-03-08 DIAGNOSIS — E039 Hypothyroidism, unspecified: Secondary | ICD-10-CM

## 2023-03-08 DIAGNOSIS — Z23 Encounter for immunization: Secondary | ICD-10-CM

## 2023-03-08 DIAGNOSIS — I10 Essential (primary) hypertension: Secondary | ICD-10-CM

## 2023-03-08 DIAGNOSIS — H9190 Unspecified hearing loss, unspecified ear: Secondary | ICD-10-CM | POA: Insufficient documentation

## 2023-03-08 DIAGNOSIS — E781 Pure hyperglyceridemia: Secondary | ICD-10-CM

## 2023-03-08 LAB — BASIC METABOLIC PANEL
BUN: 12 mg/dL (ref 6–23)
CO2: 26 mEq/L (ref 19–32)
Calcium: 9.9 mg/dL (ref 8.4–10.5)
Chloride: 105 mEq/L (ref 96–112)
Creatinine, Ser: 1.04 mg/dL (ref 0.40–1.50)
GFR: 73.66 mL/min (ref >60.00)
Glucose, Bld: 94 mg/dL (ref 70–99)
Potassium: 4.2 mEq/L (ref 3.5–5.1)
Sodium: 141 mEq/L (ref 135–145)

## 2023-03-08 LAB — LIPID PANEL
Cholesterol: 169 mg/dL (ref 0–200)
HDL: 45.3 mg/dL (ref >39.00)
LDL Cholesterol: 96 mg/dL (ref 0–99)
NonHDL: 123.88
Total CHOL/HDL Ratio: 4
Triglycerides: 137 mg/dL (ref 0.0–149.0)
VLDL: 27.4 mg/dL (ref 0.0–40.0)

## 2023-03-08 LAB — T4, FREE: Free T4: 1.24 ng/dL (ref 0.60–1.60)

## 2023-03-08 LAB — TSH: TSH: 2.45 u[IU]/mL (ref 0.35–5.50)

## 2023-03-08 MED ORDER — LEVOTHYROXINE SODIUM 125 MCG PO TABS: ORAL_TABLET | ORAL | 1 refills | Status: DC

## 2023-03-08 NOTE — Patient Instructions (Signed)
Go to lab Continue Heart healthy diet and daily exercise. Maintain current medications.

## 2023-03-08 NOTE — Assessment & Plan Note (Signed)
Stable mood, weight, and sleep Repeat Tsh and T4 Maintain Levothyroxine dose

## 2023-03-08 NOTE — Assessment & Plan Note (Signed)
Maintains vegan diet Advised to be cautious about high carb/high fat diet. Also start oral multivitamin 1tab daily Repeat lipid panel

## 2023-03-08 NOTE — Assessment & Plan Note (Signed)
BP at goal with lisinopril BP Readings from Last 3 Encounters:  03/08/23 126/74  09/05/22 120/78  08/07/22 (!) 140/80    Check BMP Maintain med dose F/up in 6months

## 2023-03-08 NOTE — Progress Notes (Signed)
Established Patient Visit  Patient: Nicholas Moody   DOB: 15-Jan-1956   67 y.o. Male  MRN: [id number] Visit Date: 03/08/2023  Subjective:    Chief Complaint  Patient presents with   Hypertension    6 month f/u HTN.  No concerns. Fasting today.    HPI Hypertension BP at goal with lisinopril BP Readings from Last 3 Encounters:  03/08/23 126/74  09/05/22 120/78  08/07/22 (!) 140/80    Check BMP Maintain med dose F/up in 6months  Hypothyroidism Stable mood, weight, and sleep Repeat Tsh and T4 Maintain Levothyroxine dose  Hypertriglyceridemia Maintains vegan diet Advised to be cautious about high carb/high fat diet. Also start oral multivitamin 1tab daily Repeat lipid panel  Reviewed medical, surgical, and social history today  Medications: Outpatient Medications Prior to Visit  Medication Sig   levothyroxine (SYNTHROID) 125 MCG tablet TAKE ONE TABLET BY MOUTH ONE TIME DAILY   lisinopril (ZESTRIL) 20 MG tablet TAKE ONE TABLET BY MOUTH ONE TIME DAILY   sildenafil (VIAGRA) 100 MG tablet TAKE ONE TABLET BY MOUTH ONE TIME DAILY AS NEEDED   [DISCONTINUED] DULoxetine (CYMBALTA) 20 MG capsule Take 1 capsule (20 mg total) by mouth daily.   No facility-administered medications prior to visit.   Reviewed past medical and social history.   ROS per HPI above      Objective:  BP 126/74   Pulse 72   Temp 98.7 F (37.1 C) (Temporal)   Ht 5\' 8"  (1.727 m)   Wt 181 lb (82.1 kg)   SpO2 98%   BMI 27.52 kg/m      Physical Exam Vitals and nursing note reviewed.  Cardiovascular:     Rate and Rhythm: Normal rate and regular rhythm.     Pulses: Normal pulses.     Heart sounds: Normal heart sounds.  Pulmonary:     Effort: Pulmonary effort is normal.     Breath sounds: Normal breath sounds.  Neurological:     Mental Status: He is alert and oriented to person, place, and time.     No results found for any visits on 03/08/23.    Assessment & Plan:     Problem List Items Addressed This Visit     Hypertension - Primary    BP at goal with lisinopril BP Readings from Last 3 Encounters:  03/08/23 126/74  09/05/22 120/78  08/07/22 (!) 140/80    Check BMP Maintain med dose F/up in 6months      Relevant Orders   Basic metabolic panel   Hypertriglyceridemia    Maintains vegan diet Advised to be cautious about high carb/high fat diet. Also start oral multivitamin 1tab daily Repeat lipid panel      Relevant Orders   Lipid panel   Hypothyroidism    Stable mood, weight, and sleep Repeat Tsh and T4 Maintain Levothyroxine dose      Relevant Orders   TSH   T4, free   Other Visit Diagnoses     Immunization due       Relevant Orders   Flu Vaccine Trivalent High Dose (Fluad) (Completed)      Return in about 6 months (around 09/05/2023) for CPE (fasting).     Alysia Penna, NP

## 2023-03-16 ENCOUNTER — Other Ambulatory Visit: Payer: Self-pay | Admitting: Nurse Practitioner

## 2023-03-16 DIAGNOSIS — F411 Generalized anxiety disorder: Secondary | ICD-10-CM

## 2023-03-23 ENCOUNTER — Inpatient Hospital Stay (HOSPITAL_COMMUNITY)
Admission: EM | Admit: 2023-03-23 | Discharge: 2023-04-03 | DRG: 398 | Disposition: A | Discharge status: Home / Self Care | Payer: Medicare Other | Attending: Internal Medicine | Admitting: Internal Medicine

## 2023-03-23 ENCOUNTER — Emergency Department (HOSPITAL_COMMUNITY): Payer: Medicare Other

## 2023-03-23 ENCOUNTER — Other Ambulatory Visit: Payer: Self-pay

## 2023-03-23 ENCOUNTER — Encounter (HOSPITAL_COMMUNITY): Payer: Self-pay | Admitting: Emergency Medicine

## 2023-03-23 DIAGNOSIS — Z818 Family history of other mental and behavioral disorders: Secondary | ICD-10-CM

## 2023-03-23 DIAGNOSIS — E039 Hypothyroidism, unspecified: Secondary | ICD-10-CM | POA: Diagnosis present

## 2023-03-23 DIAGNOSIS — E871 Hypo-osmolality and hyponatremia: Secondary | ICD-10-CM | POA: Diagnosis present

## 2023-03-23 DIAGNOSIS — K630 Abscess of intestine: Secondary | ICD-10-CM | POA: Diagnosis present

## 2023-03-23 DIAGNOSIS — J45909 Unspecified asthma, uncomplicated: Secondary | ICD-10-CM | POA: Diagnosis present

## 2023-03-23 DIAGNOSIS — Z841 Family history of disorders of kidney and ureter: Secondary | ICD-10-CM

## 2023-03-23 DIAGNOSIS — Z800 Family history of malignant neoplasm of digestive organs: Secondary | ICD-10-CM

## 2023-03-23 DIAGNOSIS — Z8249 Family history of ischemic heart disease and other diseases of the circulatory system: Secondary | ICD-10-CM

## 2023-03-23 DIAGNOSIS — Z808 Family history of malignant neoplasm of other organs or systems: Secondary | ICD-10-CM

## 2023-03-23 DIAGNOSIS — E872 Acidosis, unspecified: Secondary | ICD-10-CM | POA: Diagnosis present

## 2023-03-23 DIAGNOSIS — E876 Hypokalemia: Secondary | ICD-10-CM | POA: Diagnosis present

## 2023-03-23 DIAGNOSIS — K56609 Unspecified intestinal obstruction, unspecified as to partial versus complete obstruction: Secondary | ICD-10-CM

## 2023-03-23 DIAGNOSIS — K3580 Unspecified acute appendicitis: Secondary | ICD-10-CM | POA: Diagnosis not present

## 2023-03-23 DIAGNOSIS — E781 Pure hyperglyceridemia: Secondary | ICD-10-CM | POA: Diagnosis present

## 2023-03-23 DIAGNOSIS — H903 Sensorineural hearing loss, bilateral: Secondary | ICD-10-CM | POA: Diagnosis present

## 2023-03-23 DIAGNOSIS — I10 Essential (primary) hypertension: Secondary | ICD-10-CM | POA: Diagnosis present

## 2023-03-23 DIAGNOSIS — Z811 Family history of alcohol abuse and dependence: Secondary | ICD-10-CM

## 2023-03-23 DIAGNOSIS — E7800 Pure hypercholesterolemia, unspecified: Secondary | ICD-10-CM | POA: Diagnosis present

## 2023-03-23 DIAGNOSIS — K3533 Acute appendicitis with perforation and localized peritonitis, with abscess: Secondary | ICD-10-CM | POA: Diagnosis not present

## 2023-03-23 DIAGNOSIS — Z85828 Personal history of other malignant neoplasm of skin: Secondary | ICD-10-CM

## 2023-03-23 DIAGNOSIS — K5651 Intestinal adhesions [bands], with partial obstruction: Secondary | ICD-10-CM | POA: Diagnosis present

## 2023-03-23 DIAGNOSIS — Z79890 Hormone replacement therapy (postmenopausal): Secondary | ICD-10-CM

## 2023-03-23 DIAGNOSIS — K567 Ileus, unspecified: Secondary | ICD-10-CM | POA: Diagnosis not present

## 2023-03-23 DIAGNOSIS — N179 Acute kidney failure, unspecified: Secondary | ICD-10-CM | POA: Diagnosis not present

## 2023-03-23 DIAGNOSIS — E739 Lactose intolerance, unspecified: Secondary | ICD-10-CM | POA: Diagnosis present

## 2023-03-23 DIAGNOSIS — Z79899 Other long term (current) drug therapy: Secondary | ICD-10-CM

## 2023-03-23 LAB — CBC
HCT: 39 % (ref 39.0–52.0)
Hemoglobin: 14 g/dL (ref 13.0–17.0)
MCH: 32.5 pg (ref 26.0–34.0)
MCHC: 35.9 g/dL (ref 30.0–36.0)
MCV: 90.5 fL (ref 80.0–100.0)
Platelets: 301 10*3/uL (ref 150–400)
RBC: 4.31 MIL/uL (ref 4.22–5.81)
RDW: 11.7 % (ref 11.5–15.5)
WBC: 25.2 10*3/uL — ABNORMAL HIGH (ref 4.0–10.5)
nRBC: 0 % (ref 0.0–0.2)

## 2023-03-23 LAB — COMPREHENSIVE METABOLIC PANEL
ALT: 22 U/L (ref 0–44)
AST: 33 U/L (ref 15–41)
Albumin: 4.1 g/dL (ref 3.5–5.0)
Alkaline Phosphatase: 65 U/L (ref 38–126)
Anion gap: 16 — ABNORMAL HIGH (ref 5–15)
BUN: 14 mg/dL (ref 8–23)
CO2: 20 mmol/L — ABNORMAL LOW (ref 22–32)
Calcium: 9.3 mg/dL (ref 8.9–10.3)
Chloride: 95 mmol/L — ABNORMAL LOW (ref 98–111)
Creatinine, Ser: 1.11 mg/dL (ref 0.61–1.24)
GFR, Estimated: >60 mL/min (ref >60)
Glucose, Bld: 115 mg/dL — ABNORMAL HIGH (ref 70–99)
Potassium: 3.3 mmol/L — ABNORMAL LOW (ref 3.5–5.1)
Sodium: 131 mmol/L — ABNORMAL LOW (ref 135–145)
Total Bilirubin: 1.1 mg/dL (ref 0.3–1.2)
Total Protein: 7.4 g/dL (ref 6.5–8.1)

## 2023-03-23 LAB — URINALYSIS, ROUTINE W REFLEX MICROSCOPIC
Bilirubin Urine: NEGATIVE
Glucose, UA: NEGATIVE mg/dL
Ketones, ur: 20 mg/dL — AB
Leukocytes,Ua: NEGATIVE
Nitrite: NEGATIVE
Protein, ur: NEGATIVE mg/dL
Specific Gravity, Urine: 1.011 (ref 1.005–1.030)
pH: 6 (ref 5.0–8.0)

## 2023-03-23 LAB — LIPASE, BLOOD: Lipase: 20 U/L (ref 11–51)

## 2023-03-23 MED ORDER — ONDANSETRON HCL 4 MG/2ML IJ SOLN
4.0000 mg | Freq: Once | INTRAMUSCULAR | Status: AC
  Administered 2023-03-23: 4 mg via INTRAVENOUS
  Filled 2023-03-23: qty 2

## 2023-03-23 MED ORDER — IOHEXOL 350 MG/ML SOLN
75.0000 mL | Freq: Once | INTRAVENOUS | Status: AC | PRN
  Administered 2023-03-23: 75 mL via INTRAVENOUS

## 2023-03-23 MED ORDER — MORPHINE SULFATE (PF) 4 MG/ML IV SOLN
4.0000 mg | Freq: Once | INTRAVENOUS | Status: AC
  Administered 2023-03-23: 4 mg via INTRAVENOUS
  Filled 2023-03-23: qty 1

## 2023-03-23 MED ORDER — PIPERACILLIN-TAZOBACTAM 3.375 G IVPB 30 MIN
3.3750 g | Freq: Once | INTRAVENOUS | Status: AC
  Administered 2023-03-23: 3.375 g via INTRAVENOUS
  Filled 2023-03-23: qty 50

## 2023-03-23 NOTE — ED Provider Notes (Signed)
[place] EMERGENCY DEPARTMENT AT [facility] Provider Note   CSN: [id number] Arrival date & time: 03/23/23  1823     History  Chief Complaint  Patient presents with   Abdominal Pain   Emesis    Nicholas Moody is a 67 y.o. male. Patient with past medical history significant for GAD, HTN, nephrolithiasis presents to the emergency department complaining of right lower quadrant abdominal pain which been ongoing for 1 week.  Patient endorses nausea, vomiting, watery diarrhea.  He states his appetite has been decreased.  He has been able to keep down small amounts of fluid.  Patient also endorses some mild dysuria.  Patient denies chest pain, shortness of breath   Abdominal Pain Associated symptoms: vomiting   Emesis Associated symptoms: abdominal pain        Home Medications Prior to Admission medications   Medication Sig Start Date End Date Taking? Authorizing Provider  levothyroxine (SYNTHROID) 125 MCG tablet TAKE ONE TABLET BY MOUTH ONE TIME DAILY 03/08/23   Nche, Bonna Gains, NP  lisinopril (ZESTRIL) 20 MG tablet TAKE ONE TABLET BY MOUTH ONE TIME DAILY 12/27/22   Nche, Bonna Gains, NP  sildenafil (VIAGRA) 100 MG tablet TAKE ONE TABLET BY MOUTH ONE TIME DAILY AS NEEDED 06/05/22   Nche, Bonna Gains, NP      Allergies    Lactose intolerance (gi)    Review of Systems   Review of Systems  Gastrointestinal:  Positive for abdominal pain and vomiting.    Physical Exam Updated Vital Signs BP (!) 143/96 (BP Location: Right Arm)   Pulse 100   Temp 99.4 F (37.4 C) (Oral)   Resp (!) 21   Ht 5\' 8"  (1.727 m)   Wt 82.1 kg   SpO2 100%   BMI 27.52 kg/m  Physical Exam Vitals and nursing note reviewed.  Constitutional:      General: He is not in acute distress.    Appearance: He is well-developed.  HENT:     Head: Normocephalic and atraumatic.  Eyes:     Conjunctiva/sclera: Conjunctivae normal.  Cardiovascular:     Rate and Rhythm: Normal rate and regular  rhythm.     Heart sounds: No murmur heard. Pulmonary:     Effort: Pulmonary effort is normal. No respiratory distress.     Breath sounds: Normal breath sounds.  Abdominal:     Palpations: Abdomen is soft.     Tenderness: There is abdominal tenderness in the right lower quadrant.  Musculoskeletal:        General: No swelling.     Cervical back: Neck supple.  Skin:    General: Skin is warm and dry.     Capillary Refill: Capillary refill takes less than 2 seconds.  Neurological:     Mental Status: He is alert.  Psychiatric:        Mood and Affect: Mood normal.     ED Results / Procedures / Treatments   Labs (all labs ordered are listed, but only abnormal results are displayed) Labs Reviewed  COMPREHENSIVE METABOLIC PANEL - Abnormal; Notable for the following components:      Result Value   Sodium 131 (*)    Potassium 3.3 (*)    Chloride 95 (*)    CO2 20 (*)    Glucose, Bld 115 (*)    Anion gap 16 (*)    All other components within normal limits  CBC - Abnormal; Notable for the following components:   WBC 25.2 (*)  All other components within normal limits  URINALYSIS, ROUTINE W REFLEX MICROSCOPIC - Abnormal; Notable for the following components:   Hgb urine dipstick MODERATE (*)    Ketones, ur 20 (*)    Bacteria, UA RARE (*)    All other components within normal limits  LIPASE, BLOOD    EKG None  Radiology CT ABDOMEN PELVIS W CONTRAST  Result Date: 03/23/2023 CLINICAL DATA:  Right lower quadrant abdominal pain. EXAM: CT ABDOMEN AND PELVIS WITH CONTRAST TECHNIQUE: Multidetector CT imaging of the abdomen and pelvis was performed using the standard protocol following bolus administration of intravenous contrast. RADIATION DOSE REDUCTION: This exam was performed according to the departmental dose-optimization program which includes automated exposure control, adjustment of the mA and/or kV according to patient size and/or use of iterative reconstruction technique.  CONTRAST:  75mL OMNIPAQUE IOHEXOL 350 MG/ML SOLN COMPARISON:  CT 10/24/2009 FINDINGS: Lower chest: Subsegmental atelectasis or scarring in the left lower lobe. No pleural effusion. Hepatobiliary: 3.2 cm cyst in the right lobe of the liver. There additional scattered small subcentimeter hypodensities are too small to characterize. No suspicious liver lesion. Gallbladder physiologically distended, no calcified stone. No biliary dilatation. Pancreas: No ductal dilatation or inflammation. Spleen: Normal in size without focal abnormality. Adrenals/Urinary Tract: No suspicious adrenal nodule. No hydronephrosis or perinephric edema. Homogeneous renal enhancement with symmetric excretion on delayed phase imaging. Nonobstructing stones in the lower pole of the right kidney, largest 8 mm. There are bilateral renal cysts. No further follow-up imaging is recommended. Urinary bladder is physiologically distended. Mild wall thickening about the right aspect of the bladder likely reactive. Stomach/Bowel: Inflammatory process in the right lower quadrant. Inflamed tubular structure measuring 8 mm, coronal series 6, image 48 and series 3, image 61, likely represents an inflamed appendix. Just distal to this is a small peripherally enhancing fluid collection measuring 2 x 0.9 x 0.9 cm series 6, image 47 and series 3, image 56, suspicious for small periappendiceal abscess. There is marked fat stranding in the right lower quadrant as well as wall thickening of the cecum. Small amounts of right lower quadrant non organized free fluid. Mild wall thickening about the terminal ileum also seen. Distal ileal small bowel is fluid-filled and mildly prominent, likely reactive. No obstruction. Small volume of colonic stool. Left colonic diverticulosis. No focal diverticulitis. No free air. Vascular/Lymphatic: Aortic atherosclerosis. No aneurysm. The portal vein is patent. Small ileocolic nodes typically reactive. Reproductive: Prominent prostate  spans 5.8 cm transverse. Other: Inflammatory process in the right lower quadrant with fat stranding and small amount of non organized free fluid. Small fluid collection as described. There is no free air. No abdominal wall hernia. Musculoskeletal: Lower lumbar facet hypertrophy. There are no acute or suspicious osseous abnormalities. No intramuscular collection. IMPRESSION: 1. Findings consistent with acute appendicitis with small periappendiceal abscess measuring 2 x 0.9 x 0.9 cm. There is marked inflammation in the right lower quadrant. Wall thickening of the cecum and distal ileum likely reactive. 2. Nonobstructing right nephrolithiasis. 3. Colonic diverticulosis without focal diverticulitis. 4. Enlarged prostate. Aortic Atherosclerosis (ICD10-I70.0). Electronically Signed   By: Narda Rutherford M.D.   On: 03/23/2023 22:13    Procedures .Critical Care  Performed by: Darrick Grinder, PA-C Authorized by: Darrick Grinder, PA-C   Critical care provider statement:    Critical care time (minutes):  30   Critical care was necessary to treat or prevent imminent or life-threatening deterioration of the following conditions: Acute appendicitis.   Critical care was time  spent personally by me on the following activities:  Development of treatment plan with patient or surrogate, discussions with consultants, evaluation of patient's response to treatment, examination of patient, ordering and review of laboratory studies, ordering and review of radiographic studies, ordering and performing treatments and interventions, pulse oximetry, re-evaluation of patient's condition and review of old charts   Care discussed with: admitting provider       Medications Ordered in ED Medications  piperacillin-tazobactam (ZOSYN) IVPB 3.375 g (3.375 g Intravenous New Bag/Given 03/23/23 2249)  ondansetron (ZOFRAN) injection 4 mg (4 mg Intravenous Given 03/23/23 1841)  iohexol (OMNIPAQUE) 350 MG/ML injection 75 mL (75 mLs  Intravenous Contrast Given 03/23/23 2002)  morphine (PF) 4 MG/ML injection 4 mg (4 mg Intravenous Given 03/23/23 2247)    ED Course/ Medical Decision Making/ A&P                                 Medical Decision Making Amount and/or Complexity of Data Reviewed Labs: ordered.  Risk Prescription drug management. Decision regarding hospitalization.   This patient presents to the ED for concern of abdominal pain, this involves an extensive number of treatment options, and is a complaint that carries with it a high risk of complications and morbidity.  The differential diagnosis includes appendicitis, gastroenteritis, nephrolithiasis, others   Co morbidities that complicate the patient evaluation  Hypertension, hypothyroidism   Additional history obtained:  Additional history obtained from EMS  Lab Tests:  I Ordered, and personally interpreted labs.  The pertinent results include: WBC 25.2, potassium 3.3, anion gap 16, UA with moderate hemoglobin, ketones, rare bacteria   Imaging Studies ordered:  I ordered imaging studies including CT abdomen pelvis with contrast I independently visualized and interpreted imaging which showed  1. Findings consistent with acute appendicitis with small  periappendiceal abscess measuring 2 x 0.9 x 0.9 cm. There is marked  inflammation in the right lower quadrant. Wall thickening of the  cecum and distal ileum likely reactive.  2. Nonobstructing right nephrolithiasis.  3. Colonic diverticulosis without focal diverticulitis.  4. Enlarged prostate.   I agree with the radiologist interpretation   Consultations Obtained:  I requested consultation with the general surgeon, Dr.Lovick  and discussed lab and imaging findings as well as pertinent plan - they recommend: Admission to medicine, will formally consult, no plans for surgery at this time  Requested consultation with the hospitalist service for admission.   Problem List / ED Course /  Critical interventions / Medication management   I ordered medication including morphine for pain, Zofran for nausea, Zosyn for antibiotic coverage Reevaluation of the patient after these medicines showed that the patient improved I have reviewed the patients home medicines and have made adjustments as needed   Test / Admission - Considered:  Patient with acute appendicitis.  Plan on admission to medicine for continued antibiotics and monitoring with formal surgical consult pending.         Final Clinical Impression(s) / ED Diagnoses Final diagnoses:  Acute appendicitis, unspecified acute appendicitis type    Rx / DC Orders ED Discharge Orders     None         Pamala Duffel 03/24/23 9811    Pricilla Loveless, MD 03/25/23 2351

## 2023-03-23 NOTE — ED Provider Triage Note (Signed)
Emergency Medicine Provider Triage Evaluation Note  JERMON CHALFANT , a 67 y.o. male  was evaluated in triage.  Pt complains of RLQ abdominal pain, mod-severe, NVD.  Review of Systems  Positive: Abdominal pain, NVD Negative: Weakness, CP, SOB, AMS  Physical Exam  BP 138/88   Pulse (!) 103   Temp 98.2 F (36.8 C) (Oral)   Resp 18   Ht 5\' 8"  (1.727 m)   Wt 82.1 kg   SpO2 100%   BMI 27.52 kg/m  Gen:   Awake, no distress   Resp:  Normal effort  MSK:   Moves extremities without difficulty  Other:  Tender to RLQ on abd exam   Medical Decision Making  Medically screening exam initiated at 6:37 PM.  Appropriate orders placed.  KHYLIN GUTRIDGE was informed that the remainder of the evaluation will be completed by another provider, this initial triage assessment does not replace that evaluation, and the importance of remaining in the ED until their evaluation is complete.  Patient requesting nausea medication.    Smitty Knudsen, [place] 03/23/23 5621
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
## 2023-03-23 NOTE — ED Provider Notes (Incomplete)
[place] EMERGENCY DEPARTMENT AT [facility] Provider Note   CSN: [id number] Arrival date & time: 03/23/23  1823     History {Add pertinent medical, surgical, social history, OB history to HPI:1} Chief Complaint  Patient presents with  . Abdominal Pain  . Emesis    Nicholas Moody is a 67 y.o. male. Patient with past medical history significant for GAD, HTN, nephrolithiasis presents to the emergency department complaining of right lower quadrant abdominal pain which been ongoing for 1 week.  Patient endorses nausea, vomiting, watery diarrhea.  He states his appetite has been decreased.  He has been able to keep down small amounts of fluid.  Patient also endorses some mild dysuria.  Patient denies chest pain, shortness of breath   Abdominal Pain Associated symptoms: vomiting   Emesis Associated symptoms: abdominal pain        Home Medications Prior to Admission medications   Medication Sig Start Date End Date Taking? Authorizing Provider  levothyroxine (SYNTHROID) 125 MCG tablet TAKE ONE TABLET BY MOUTH ONE TIME DAILY 03/08/23   Nche, Bonna Gains, NP  lisinopril (ZESTRIL) 20 MG tablet TAKE ONE TABLET BY MOUTH ONE TIME DAILY 12/27/22   Nche, Bonna Gains, NP  sildenafil (VIAGRA) 100 MG tablet TAKE ONE TABLET BY MOUTH ONE TIME DAILY AS NEEDED 06/05/22   Nche, Bonna Gains, NP      Allergies    Lactose intolerance (gi)    Review of Systems   Review of Systems  Gastrointestinal:  Positive for abdominal pain and vomiting.    Physical Exam Updated Vital Signs BP (!) 143/96 (BP Location: Right Arm)   Pulse 100   Temp 99.4 F (37.4 C) (Oral)   Resp (!) 21   Ht 5\' 8"  (1.727 m)   Wt 82.1 kg   SpO2 100%   BMI 27.52 kg/m  Physical Exam Vitals and nursing note reviewed.  Constitutional:      General: He is not in acute distress.    Appearance: He is well-developed.  HENT:     Head: Normocephalic and atraumatic.  Eyes:     Conjunctiva/sclera: Conjunctivae  normal.  Cardiovascular:     Rate and Rhythm: Normal rate and regular rhythm.     Heart sounds: No murmur heard. Pulmonary:     Effort: Pulmonary effort is normal. No respiratory distress.     Breath sounds: Normal breath sounds.  Abdominal:     Palpations: Abdomen is soft.     Tenderness: There is abdominal tenderness in the right lower quadrant.  Musculoskeletal:        General: No swelling.     Cervical back: Neck supple.  Skin:    General: Skin is warm and dry.     Capillary Refill: Capillary refill takes less than 2 seconds.  Neurological:     Mental Status: He is alert.  Psychiatric:        Mood and Affect: Mood normal.     ED Results / Procedures / Treatments   Labs (all labs ordered are listed, but only abnormal results are displayed) Labs Reviewed  COMPREHENSIVE METABOLIC PANEL - Abnormal; Notable for the following components:      Result Value   Sodium 131 (*)    Potassium 3.3 (*)    Chloride 95 (*)    CO2 20 (*)    Glucose, Bld 115 (*)    Anion gap 16 (*)    All other components within normal limits  CBC - Abnormal; Notable  for the following components:   WBC 25.2 (*)    All other components within normal limits  URINALYSIS, ROUTINE W REFLEX MICROSCOPIC - Abnormal; Notable for the following components:   Hgb urine dipstick MODERATE (*)    Ketones, ur 20 (*)    Bacteria, UA RARE (*)    All other components within normal limits  LIPASE, BLOOD    EKG None  Radiology CT ABDOMEN PELVIS W CONTRAST  Result Date: 03/23/2023 CLINICAL DATA:  Right lower quadrant abdominal pain. EXAM: CT ABDOMEN AND PELVIS WITH CONTRAST TECHNIQUE: Multidetector CT imaging of the abdomen and pelvis was performed using the standard protocol following bolus administration of intravenous contrast. RADIATION DOSE REDUCTION: This exam was performed according to the departmental dose-optimization program which includes automated exposure control, adjustment of the mA and/or kV according  to patient size and/or use of iterative reconstruction technique. CONTRAST:  75mL OMNIPAQUE IOHEXOL 350 MG/ML SOLN COMPARISON:  CT 10/24/2009 FINDINGS: Lower chest: Subsegmental atelectasis or scarring in the left lower lobe. No pleural effusion. Hepatobiliary: 3.2 cm cyst in the right lobe of the liver. There additional scattered small subcentimeter hypodensities are too small to characterize. No suspicious liver lesion. Gallbladder physiologically distended, no calcified stone. No biliary dilatation. Pancreas: No ductal dilatation or inflammation. Spleen: Normal in size without focal abnormality. Adrenals/Urinary Tract: No suspicious adrenal nodule. No hydronephrosis or perinephric edema. Homogeneous renal enhancement with symmetric excretion on delayed phase imaging. Nonobstructing stones in the lower pole of the right kidney, largest 8 mm. There are bilateral renal cysts. No further follow-up imaging is recommended. Urinary bladder is physiologically distended. Mild wall thickening about the right aspect of the bladder likely reactive. Stomach/Bowel: Inflammatory process in the right lower quadrant. Inflamed tubular structure measuring 8 mm, coronal series 6, image 48 and series 3, image 61, likely represents an inflamed appendix. Just distal to this is a small peripherally enhancing fluid collection measuring 2 x 0.9 x 0.9 cm series 6, image 47 and series 3, image 56, suspicious for small periappendiceal abscess. There is marked fat stranding in the right lower quadrant as well as wall thickening of the cecum. Small amounts of right lower quadrant non organized free fluid. Mild wall thickening about the terminal ileum also seen. Distal ileal small bowel is fluid-filled and mildly prominent, likely reactive. No obstruction. Small volume of colonic stool. Left colonic diverticulosis. No focal diverticulitis. No free air. Vascular/Lymphatic: Aortic atherosclerosis. No aneurysm. The portal vein is patent. Small  ileocolic nodes typically reactive. Reproductive: Prominent prostate spans 5.8 cm transverse. Other: Inflammatory process in the right lower quadrant with fat stranding and small amount of non organized free fluid. Small fluid collection as described. There is no free air. No abdominal wall hernia. Musculoskeletal: Lower lumbar facet hypertrophy. There are no acute or suspicious osseous abnormalities. No intramuscular collection. IMPRESSION: 1. Findings consistent with acute appendicitis with small periappendiceal abscess measuring 2 x 0.9 x 0.9 cm. There is marked inflammation in the right lower quadrant. Wall thickening of the cecum and distal ileum likely reactive. 2. Nonobstructing right nephrolithiasis. 3. Colonic diverticulosis without focal diverticulitis. 4. Enlarged prostate. Aortic Atherosclerosis (ICD10-I70.0). Electronically Signed   By: Narda Rutherford M.D.   On: 03/23/2023 22:13    Procedures Procedures  {Document cardiac monitor, telemetry assessment procedure when appropriate:1}  Medications Ordered in ED Medications  piperacillin-tazobactam (ZOSYN) IVPB 3.375 g (3.375 g Intravenous New Bag/Given 03/23/23 2249)  ondansetron (ZOFRAN) injection 4 mg (4 mg Intravenous Given 03/23/23 1841)  iohexol (OMNIPAQUE)  350 MG/ML injection 75 mL (75 mLs Intravenous Contrast Given 03/23/23 2002)  morphine (PF) 4 MG/ML injection 4 mg (4 mg Intravenous Given 03/23/23 2247)    ED Course/ Medical Decision Making/ A&P   {   Click here for ABCD2, HEART and other calculatorsREFRESH Note before signing :1}                              Medical Decision Making Amount and/or Complexity of Data Reviewed Labs: ordered.  Risk Prescription drug management.   This patient presents to the ED for concern of abdominal pain, this involves an extensive number of treatment options, and is a complaint that carries with it a high risk of complications and morbidity.  The differential diagnosis includes  appendicitis, gastroenteritis, nephrolithiasis, others   Co morbidities that complicate the patient evaluation  Hypertension, hypothyroidism   Additional history obtained:  Additional history obtained from EMS  Lab Tests:  I Ordered, and personally interpreted labs.  The pertinent results include: WBC 25.2, potassium 3.3, anion gap 16, UA with moderate hemoglobin, ketones, rare bacteria   Imaging Studies ordered:  I ordered imaging studies including CT abdomen pelvis with contrast I independently visualized and interpreted imaging which showed  1. Findings consistent with acute appendicitis with small  periappendiceal abscess measuring 2 x 0.9 x 0.9 cm. There is marked  inflammation in the right lower quadrant. Wall thickening of the  cecum and distal ileum likely reactive.  2. Nonobstructing right nephrolithiasis.  3. Colonic diverticulosis without focal diverticulitis.  4. Enlarged prostate.   I agree with the radiologist interpretation   Consultations Obtained:  I requested consultation with the general surgeon, Dr.Lovick  and discussed lab and imaging findings as well as pertinent plan - they recommend: ***   Problem List / ED Course / Critical interventions / Medication management   I ordered medication including morphine for pain, Zofran for nausea, Zosyn for antibiotic coverage Reevaluation of the patient after these medicines showed that the patient improved I have reviewed the patients home medicines and have made adjustments as needed   Test / Admission - Considered:  Patient with acute appendicitis with need surgical consultation for definitive management.   {Document critical care time when appropriate:1} {Document review of labs and clinical decision tools ie heart score, Chads2Vasc2 etc:1}  {Document your independent review of radiology images, and any outside records:1} {Document your discussion with family members, caretakers, and with  consultants:1} {Document social determinants of health affecting pt's care:1} {Document your decision making why or why not admission, treatments were needed:1} Final Clinical Impression(s) / ED Diagnoses Final diagnoses:  Acute appendicitis, unspecified acute appendicitis type    Rx / DC Orders ED Discharge Orders     None

## 2023-03-24 ENCOUNTER — Encounter (HOSPITAL_COMMUNITY): Payer: Self-pay | Admitting: Internal Medicine

## 2023-03-24 ENCOUNTER — Other Ambulatory Visit: Payer: Self-pay

## 2023-03-24 DIAGNOSIS — K567 Ileus, unspecified: Secondary | ICD-10-CM | POA: Diagnosis not present

## 2023-03-24 DIAGNOSIS — E7800 Pure hypercholesterolemia, unspecified: Secondary | ICD-10-CM | POA: Diagnosis present

## 2023-03-24 DIAGNOSIS — Z811 Family history of alcohol abuse and dependence: Secondary | ICD-10-CM | POA: Diagnosis not present

## 2023-03-24 DIAGNOSIS — Z841 Family history of disorders of kidney and ureter: Secondary | ICD-10-CM | POA: Diagnosis not present

## 2023-03-24 DIAGNOSIS — J45909 Unspecified asthma, uncomplicated: Secondary | ICD-10-CM | POA: Diagnosis present

## 2023-03-24 DIAGNOSIS — H903 Sensorineural hearing loss, bilateral: Secondary | ICD-10-CM | POA: Diagnosis present

## 2023-03-24 DIAGNOSIS — K3532 Acute appendicitis with perforation and localized peritonitis, without abscess: Secondary | ICD-10-CM | POA: Diagnosis not present

## 2023-03-24 DIAGNOSIS — E739 Lactose intolerance, unspecified: Secondary | ICD-10-CM | POA: Diagnosis present

## 2023-03-24 DIAGNOSIS — K3580 Unspecified acute appendicitis: Secondary | ICD-10-CM | POA: Diagnosis present

## 2023-03-24 DIAGNOSIS — E872 Acidosis, unspecified: Secondary | ICD-10-CM | POA: Diagnosis present

## 2023-03-24 DIAGNOSIS — K35211 Acute appendicitis with generalized peritonitis, with perforation and abscess: Secondary | ICD-10-CM | POA: Diagnosis not present

## 2023-03-24 DIAGNOSIS — Z8249 Family history of ischemic heart disease and other diseases of the circulatory system: Secondary | ICD-10-CM | POA: Diagnosis not present

## 2023-03-24 DIAGNOSIS — N179 Acute kidney failure, unspecified: Secondary | ICD-10-CM | POA: Diagnosis not present

## 2023-03-24 DIAGNOSIS — Z79890 Hormone replacement therapy (postmenopausal): Secondary | ICD-10-CM | POA: Diagnosis not present

## 2023-03-24 DIAGNOSIS — E871 Hypo-osmolality and hyponatremia: Secondary | ICD-10-CM | POA: Diagnosis present

## 2023-03-24 DIAGNOSIS — Z818 Family history of other mental and behavioral disorders: Secondary | ICD-10-CM | POA: Diagnosis not present

## 2023-03-24 DIAGNOSIS — Z800 Family history of malignant neoplasm of digestive organs: Secondary | ICD-10-CM | POA: Diagnosis not present

## 2023-03-24 DIAGNOSIS — K3589 Other acute appendicitis without perforation or gangrene: Secondary | ICD-10-CM | POA: Diagnosis not present

## 2023-03-24 DIAGNOSIS — Z808 Family history of malignant neoplasm of other organs or systems: Secondary | ICD-10-CM | POA: Diagnosis not present

## 2023-03-24 DIAGNOSIS — Z85828 Personal history of other malignant neoplasm of skin: Secondary | ICD-10-CM | POA: Diagnosis not present

## 2023-03-24 DIAGNOSIS — K3533 Acute appendicitis with perforation and localized peritonitis, with abscess: Secondary | ICD-10-CM | POA: Diagnosis present

## 2023-03-24 DIAGNOSIS — E781 Pure hyperglyceridemia: Secondary | ICD-10-CM | POA: Diagnosis present

## 2023-03-24 DIAGNOSIS — K630 Abscess of intestine: Secondary | ICD-10-CM | POA: Diagnosis present

## 2023-03-24 DIAGNOSIS — E039 Hypothyroidism, unspecified: Secondary | ICD-10-CM | POA: Diagnosis present

## 2023-03-24 DIAGNOSIS — E876 Hypokalemia: Secondary | ICD-10-CM | POA: Diagnosis present

## 2023-03-24 DIAGNOSIS — I10 Essential (primary) hypertension: Secondary | ICD-10-CM | POA: Diagnosis present

## 2023-03-24 DIAGNOSIS — K5651 Intestinal adhesions [bands], with partial obstruction: Secondary | ICD-10-CM | POA: Diagnosis present

## 2023-03-24 DIAGNOSIS — Z79899 Other long term (current) drug therapy: Secondary | ICD-10-CM | POA: Diagnosis not present

## 2023-03-24 HISTORY — DX: Unspecified acute appendicitis: K35.80

## 2023-03-24 LAB — CBC WITH DIFFERENTIAL/PLATELET
Abs Immature Granulocytes: 0 10*3/uL (ref 0.00–0.07)
Basophils Absolute: 0 10*3/uL (ref 0.0–0.1)
Basophils Relative: 0 %
Eosinophils Absolute: 0 10*3/uL (ref 0.0–0.5)
Eosinophils Relative: 0 %
HCT: 35.5 % — ABNORMAL LOW (ref 39.0–52.0)
Hemoglobin: 12.9 g/dL — ABNORMAL LOW (ref 13.0–17.0)
Lymphocytes Relative: 5 %
Lymphs Abs: 1.3 10*3/uL (ref 0.7–4.0)
MCH: 32.8 pg (ref 26.0–34.0)
MCHC: 36.3 g/dL — ABNORMAL HIGH (ref 30.0–36.0)
MCV: 90.3 fL (ref 80.0–100.0)
Monocytes Absolute: 2.9 10*3/uL — ABNORMAL HIGH (ref 0.1–1.0)
Monocytes Relative: 11 %
Neutro Abs: 22.4 10*3/uL — ABNORMAL HIGH (ref 1.7–7.7)
Neutrophils Relative %: 84 %
Platelets: 277 10*3/uL (ref 150–400)
RBC: 3.93 MIL/uL — ABNORMAL LOW (ref 4.22–5.81)
RDW: 11.9 % (ref 11.5–15.5)
WBC: 26.7 10*3/uL — ABNORMAL HIGH (ref 4.0–10.5)
nRBC: 0 % (ref 0.0–0.2)
nRBC: 0 /100{WBCs}

## 2023-03-24 LAB — LACTIC ACID, PLASMA: Lactic Acid, Venous: 1.3 mmol/L (ref 0.5–1.9)

## 2023-03-24 LAB — COMPREHENSIVE METABOLIC PANEL
ALT: 18 U/L (ref 0–44)
AST: 24 U/L (ref 15–41)
Albumin: 3.3 g/dL — ABNORMAL LOW (ref 3.5–5.0)
Alkaline Phosphatase: 56 U/L (ref 38–126)
Anion gap: 15 (ref 5–15)
BUN: 14 mg/dL (ref 8–23)
CO2: 21 mmol/L — ABNORMAL LOW (ref 22–32)
Calcium: 8.7 mg/dL — ABNORMAL LOW (ref 8.9–10.3)
Chloride: 96 mmol/L — ABNORMAL LOW (ref 98–111)
Creatinine, Ser: 1.17 mg/dL (ref 0.61–1.24)
GFR, Estimated: >60 mL/min (ref >60)
Glucose, Bld: 115 mg/dL — ABNORMAL HIGH (ref 70–99)
Potassium: 3.2 mmol/L — ABNORMAL LOW (ref 3.5–5.1)
Sodium: 132 mmol/L — ABNORMAL LOW (ref 135–145)
Total Bilirubin: 1.2 mg/dL (ref 0.3–1.2)
Total Protein: 6.4 g/dL — ABNORMAL LOW (ref 6.5–8.1)

## 2023-03-24 LAB — PHOSPHORUS: Phosphorus: 2.6 mg/dL (ref 2.5–4.6)

## 2023-03-24 LAB — PROTIME-INR
INR: 1.2 (ref 0.8–1.2)
Prothrombin Time: 15.1 s (ref 11.4–15.2)

## 2023-03-24 LAB — MAGNESIUM: Magnesium: 1.7 mg/dL (ref 1.7–2.4)

## 2023-03-24 LAB — HIV ANTIBODY (ROUTINE TESTING W REFLEX): HIV Screen 4th Generation wRfx: NONREACTIVE

## 2023-03-24 LAB — APTT: aPTT: 33 s (ref 24–36)

## 2023-03-24 MED ORDER — LEVOTHYROXINE SODIUM 25 MCG PO TABS
125.0000 ug | ORAL_TABLET | Freq: Every day | ORAL | Status: DC
  Administered 2023-03-24 – 2023-04-03 (×11): 125 ug via ORAL
  Filled 2023-03-24 (×11): qty 1

## 2023-03-24 MED ORDER — HEPARIN SODIUM (PORCINE) 5000 UNIT/ML IJ SOLN
5000.0000 [IU] | Freq: Three times a day (TID) | INTRAMUSCULAR | Status: DC
  Administered 2023-03-24 – 2023-03-27 (×9): 5000 [IU] via SUBCUTANEOUS
  Filled 2023-03-24 (×9): qty 1

## 2023-03-24 MED ORDER — PROCHLORPERAZINE EDISYLATE 10 MG/2ML IJ SOLN: 5.0000 mg | Freq: Four times a day (QID) | INTRAMUSCULAR | Status: DC | PRN

## 2023-03-24 MED ORDER — MORPHINE SULFATE (PF) 2 MG/ML IV SOLN
2.0000 mg | INTRAVENOUS | Status: DC | PRN
  Administered 2023-03-24 – 2023-03-31 (×4): 2 mg via INTRAVENOUS
  Filled 2023-03-24 (×4): qty 1

## 2023-03-24 MED ORDER — POLYETHYLENE GLYCOL 3350 17 G PO PACK
17.0000 g | PACK | Freq: Every day | ORAL | Status: DC | PRN
  Administered 2023-03-29: 17 g via ORAL
  Filled 2023-03-24: qty 1

## 2023-03-24 MED ORDER — PIPERACILLIN-TAZOBACTAM 3.375 G IVPB
3.3750 g | Freq: Three times a day (TID) | INTRAVENOUS | Status: DC
  Administered 2023-03-24 – 2023-04-03 (×31): 3.375 g via INTRAVENOUS
  Filled 2023-03-24 (×34): qty 50

## 2023-03-24 MED ORDER — LACTATED RINGERS IV BOLUS (SEPSIS)
500.0000 mL | Freq: Once | INTRAVENOUS | Status: AC
  Administered 2023-03-24: 500 mL via INTRAVENOUS

## 2023-03-24 MED ORDER — ORAL CARE MOUTH RINSE: 15.0000 mL | OROMUCOSAL | Status: DC | PRN

## 2023-03-24 MED ORDER — OXYCODONE HCL 5 MG PO TABS
5.0000 mg | ORAL_TABLET | Freq: Four times a day (QID) | ORAL | Status: DC | PRN
  Administered 2023-03-24 – 2023-03-28 (×7): 5 mg via ORAL
  Filled 2023-03-24 (×8): qty 1

## 2023-03-24 MED ORDER — MELATONIN 5 MG PO TABS
5.0000 mg | ORAL_TABLET | Freq: Every evening | ORAL | Status: DC | PRN
  Administered 2023-03-28: 5 mg via ORAL
  Filled 2023-03-24: qty 1

## 2023-03-24 MED ORDER — POTASSIUM CHLORIDE CRYS ER 20 MEQ PO TBCR
20.0000 meq | EXTENDED_RELEASE_TABLET | Freq: Two times a day (BID) | ORAL | Status: AC
  Administered 2023-03-24 (×2): 20 meq via ORAL
  Filled 2023-03-24 (×2): qty 1

## 2023-03-24 MED ORDER — ACETAMINOPHEN 325 MG PO TABS
650.0000 mg | ORAL_TABLET | Freq: Four times a day (QID) | ORAL | Status: DC | PRN
  Administered 2023-03-28: 650 mg via ORAL
  Filled 2023-03-24: qty 2

## 2023-03-24 MED ORDER — LACTATED RINGERS IV BOLUS (SEPSIS): 1000.0000 mL | Freq: Once | INTRAVENOUS | Status: DC

## 2023-03-24 MED ORDER — LACTATED RINGERS IV SOLN
150.0000 mL/h | INTRAVENOUS | Status: AC
  Administered 2023-03-24 (×4): 150 mL/h via INTRAVENOUS

## 2023-03-24 NOTE — Plan of Care (Signed)
  Problem: Clinical Measurements: Goal: Cardiovascular complication will be avoided Outcome: Progressing   Problem: Activity: Goal: Risk for activity intolerance will decrease Outcome: Progressing   Problem: Nutrition: Goal: Adequate nutrition will be maintained Outcome: Progressing   Problem: Coping: Goal: Level of anxiety will decrease Outcome: Progressing   Problem: Elimination: Goal: Will not experience complications related to urinary retention Outcome: Progressing   

## 2023-03-24 NOTE — Progress Notes (Signed)
New Admission Note:  Arrival Method: Stretcher Mental Orientation: Alert and oriented x 4 Telemetry: Box 01 Assessment: Completed Skin: Warm and dry IV: Nsl Pain: 5/10 abdomen Tubes: N/A Safety Measures: Safety Fall Prevention Plan initiated.  Admission: Completed 5 M  Orientation: Patient has been orientated to the room, unit and the staff. Welcome booklet given.  Family: N/a  Orders have been reviewed and implemented. Will continue to monitor the patient. Call light has been placed within reach and bed alarm has been activated.   Guilford Shi BSN, RN  Phone Number: [phone]

## 2023-03-24 NOTE — Consult Note (Signed)
Reason for Consult/Chief Complaint: appedicitis with abscess Consultant: Marcelline Deist, [place]  Nicholas Moody is an 67 y.o. male.   HPI: 61M with RLQ pain and constipation x1w. Describes pain as crampy and thought it was related to lactose intolerance. Pain persisted and was associated with nausea/vomiting. Last BM 10/4 and was liquid. Unknown last colonoscopy, but reports he has had one in the past.   Past Medical History:  Diagnosis Date   Advanced care planning/counseling discussion 05/30/2021   Allergy    Anxiety    Asthma    Cancer (HCC)    skin cancer x 2   Depression    History of renal stone 2011   lithotripsy   Hydrocele of testis    Hypercholesteremia 08/07/2022   Hypertension    Scoliosis    sees chiropractor monthly   Scoliosis    Thyroid disease     Past Surgical History:  Procedure Laterality Date   COLONOSCOPY     HYDROCELE EXCISION  10/19/2011   Procedure: HYDROCELECTOMY ADULT;  Surgeon: Anner Crete, MD;  Location: [facility];  Service: Urology;  Laterality: Left;  left hydrocelectomy , left epididectomy    LITHOTRIPSY     x2   LITHOTRIPSY Right    Done twice in 2010   MOHS SURGERY     VASECTOMY      Family History  Problem Relation Age of Onset   Hypertension Mother    Heart disease Mother    Cancer Mother        bone   Rectal cancer Father    Stomach cancer Father    Colon cancer Father    Hypertension Father    Cancer Father        bone and colon   Kidney failure Father    Alcohol abuse Father    Anxiety disorder Father    Early death Father    Colon polyps Neg Hx    Crohn's disease Neg Hx    Esophageal cancer Neg Hx     Social History:  reports that he has never smoked. He has never used smokeless tobacco. He reports that he does not currently use alcohol. He reports current drug use. Drug: Marijuana.  Allergies:  Allergies  Allergen Reactions   Lactose Intolerance (Gi) Diarrhea and Nausea Only    Medications: I  have reviewed the patient's current medications.  Results for orders placed or performed during the hospital encounter of 03/23/23 (from the past 48 hour(s))  Lipase, blood     Status: None   Collection Time: 03/23/23  6:35 PM  Result Value Ref Range   Lipase 20 11 - 51 U/L    Comment: Performed at [facility], [address], [place], [place] [address]  Comprehensive metabolic panel     Status: Abnormal   Collection Time: 03/23/23  6:35 PM  Result Value Ref Range   Sodium 131 (L) 135 - 145 mmol/L   Potassium 3.3 (L) 3.5 - 5.1 mmol/L   Chloride 95 (L) 98 - 111 mmol/L   CO2 20 (L) 22 - 32 mmol/L   Glucose, Bld 115 (H) 70 - 99 mg/dL    Comment: Glucose reference range applies only to samples taken after fasting for at least 8 hours.   BUN 14 8 - 23 mg/dL   Creatinine, Ser 6.04 0.61 - 1.24 mg/dL   Calcium 9.3 8.9 - 54.0 mg/dL   Total Protein 7.4 6.5 - 8.1 g/dL  Albumin 4.1 3.5 - 5.0 g/dL   AST 33 15 - 41 U/L   ALT 22 0 - 44 U/L   Alkaline Phosphatase 65 38 - 126 U/L   Total Bilirubin 1.1 0.3 - 1.2 mg/dL   GFR, Estimated >56 >21 mL/min    Comment: (NOTE) Calculated using the CKD-EPI Creatinine Equation (2021)    Anion gap 16 (H) 5 - 15    Comment: Performed at [facility], [address] [place], [place] [address]  CBC     Status: Abnormal   Collection Time: 03/23/23  6:35 PM  Result Value Ref Range   WBC 25.2 (H) 4.0 - 10.5 K/uL   RBC 4.31 4.22 - 5.81 MIL/uL   Hemoglobin 14.0 13.0 - 17.0 g/dL   HCT 78.4 69.6 - 29.5 %   MCV 90.5 80.0 - 100.0 fL   MCH 32.5 26.0 - 34.0 pg   MCHC 35.9 30.0 - 36.0 g/dL   RDW 28.4 13.2 - 44.0 %   Platelets 301 150 - 400 K/uL   nRBC 0.0 0.0 - 0.2 %    Comment: Performed at [facility], [address], [place], [place] [address]  Urinalysis, Routine w reflex microscopic -Urine, Clean Catch     Status: Abnormal   Collection Time: 03/23/23  6:50 PM  Result Value Ref Range   Color, Urine YELLOW YELLOW   APPearance CLEAR  CLEAR   Specific Gravity, Urine 1.011 1.005 - 1.030   pH 6.0 5.0 - 8.0   Glucose, UA NEGATIVE NEGATIVE mg/dL   Hgb urine dipstick MODERATE (A) NEGATIVE   Bilirubin Urine NEGATIVE NEGATIVE   Ketones, ur 20 (A) NEGATIVE mg/dL   Protein, ur NEGATIVE NEGATIVE mg/dL   Nitrite NEGATIVE NEGATIVE   Leukocytes,Ua NEGATIVE NEGATIVE   RBC / HPF 6-10 0 - 5 RBC/hpf   WBC, UA 0-5 0 - 5 WBC/hpf   Bacteria, UA RARE (A) NONE SEEN   Squamous Epithelial / HPF 0-5 0 - 5 /HPF    Comment: Performed at [facility], [address], [place], [place] [address]    CT ABDOMEN PELVIS W CONTRAST  Result Date: 03/23/2023 CLINICAL DATA:  Right lower quadrant abdominal pain. EXAM: CT ABDOMEN AND PELVIS WITH CONTRAST TECHNIQUE: Multidetector CT imaging of the abdomen and pelvis was performed using the standard protocol following bolus administration of intravenous contrast. RADIATION DOSE REDUCTION: This exam was performed according to the departmental dose-optimization program which includes automated exposure control, adjustment of the mA and/or kV according to patient size and/or use of iterative reconstruction technique. CONTRAST:  75mL OMNIPAQUE IOHEXOL 350 MG/ML SOLN COMPARISON:  CT 10/24/2009 FINDINGS: Lower chest: Subsegmental atelectasis or scarring in the left lower lobe. No pleural effusion. Hepatobiliary: 3.2 cm cyst in the right lobe of the liver. There additional scattered small subcentimeter hypodensities are too small to characterize. No suspicious liver lesion. Gallbladder physiologically distended, no calcified stone. No biliary dilatation. Pancreas: No ductal dilatation or inflammation. Spleen: Normal in size without focal abnormality. Adrenals/Urinary Tract: No suspicious adrenal nodule. No hydronephrosis or perinephric edema. Homogeneous renal enhancement with symmetric excretion on delayed phase imaging. Nonobstructing stones in the lower pole of the right kidney, largest 8 mm. There are bilateral  renal cysts. No further follow-up imaging is recommended. Urinary bladder is physiologically distended. Mild wall thickening about the right aspect of the bladder likely reactive. Stomach/Bowel: Inflammatory process in the right lower quadrant. Inflamed tubular structure measuring 8 mm, coronal series 6, image  48 and series 3, image 61, likely represents an inflamed appendix. Just distal to this is a small peripherally enhancing fluid collection measuring 2 x 0.9 x 0.9 cm series 6, image 47 and series 3, image 56, suspicious for small periappendiceal abscess. There is marked fat stranding in the right lower quadrant as well as wall thickening of the cecum. Small amounts of right lower quadrant non organized free fluid. Mild wall thickening about the terminal ileum also seen. Distal ileal small bowel is fluid-filled and mildly prominent, likely reactive. No obstruction. Small volume of colonic stool. Left colonic diverticulosis. No focal diverticulitis. No free air. Vascular/Lymphatic: Aortic atherosclerosis. No aneurysm. The portal vein is patent. Small ileocolic nodes typically reactive. Reproductive: Prominent prostate spans 5.8 cm transverse. Other: Inflammatory process in the right lower quadrant with fat stranding and small amount of non organized free fluid. Small fluid collection as described. There is no free air. No abdominal wall hernia. Musculoskeletal: Lower lumbar facet hypertrophy. There are no acute or suspicious osseous abnormalities. No intramuscular collection. IMPRESSION: 1. Findings consistent with acute appendicitis with small periappendiceal abscess measuring 2 x 0.9 x 0.9 cm. There is marked inflammation in the right lower quadrant. Wall thickening of the cecum and distal ileum likely reactive. 2. Nonobstructing right nephrolithiasis. 3. Colonic diverticulosis without focal diverticulitis. 4. Enlarged prostate. Aortic Atherosclerosis (ICD10-I70.0). Electronically Signed   By: Narda Rutherford  M.D.   On: 03/23/2023 22:13    ROS 10 point review of systems is negative except as listed above in HPI.   Physical Exam Blood pressure (!) 143/96, pulse 100, temperature 99.4 F (37.4 C), temperature source Oral, resp. rate (!) 21, height 5\' 8"  (1.727 m), weight 82.1 kg, SpO2 100%. Constitutional: well-developed, well-nourished HEENT: pupils equal, round, reactive to light, 2mm b/l, moist conjunctiva, external inspection of ears and nose normal, hearing intact Oropharynx: normal oropharyngeal mucosa, normal dentition Neck: no thyromegaly, trachea midline, no midline cervical tenderness to palpation Chest: breath sounds equal bilaterally, normal respiratory effort, no midline or lateral chest wall tenderness to palpation/deformity Abdomen: soft, TTP R groin just R of pubic symphisis, no bruising, no hepatosplenomegaly Skin: warm, dry, no rashes Psych: normal memory, normal mood/affect     Assessment/Plan: Appendicitis with abscess  Appendicitis with abscess - recommend non-operative management at this point. Abx-zosyn, recommend transition to augmentin as o/p, total 7d course. Abscess too small for IR drain, trend fever/WBC/symptoms to decide about repeat imaging and indictation for drain. Recommend colonoscopy 4-6w after resolution of symptoms.  FEN - CLD DVT - SCDs, LMWH Dispo -  per primary team     Diamantina Monks, MD General and Trauma Surgery [facility]

## 2023-03-24 NOTE — Plan of Care (Signed)
  Problem: Education: Goal: Knowledge of General Education information will improve Description Including pain rating scale, medication(s)/side effects and non-pharmacologic comfort measures Outcome: Progressing   

## 2023-03-24 NOTE — Progress Notes (Signed)
Briefly, patient is a 67 year old man with HTN who was admitted earlier today for acute appendicitis with 2X 1 cm periappendiceal abscess.  Patient was seen by general surgery who are recommending conservative management with IV antibiotics.  Abscess is thought to be too small for drainage.  Patient was placed on Zosyn with transition over to p.o. Augmentin when patient able to take p.o.'s.  Plan is for outpatient colonoscopy 4 to 6 weeks after resolution of symptoms.  This morning, patient states he feels much improved.  Notes abdominal pain is almost entirely resolved.  No nausea or vomiting.  Physical exam is notable for a relatively well-appearing man sitting up in bed in NAD. Abdominal exam is notable for mild to moderate tenderness to deep palpation in LLQ, not at McBurney's point, with no rebound tenderness.  No psoas sign.  Assessment and plan: Continue treatment with IV Zosyn per general surgery recommendations Follow abdominal exam and marked leukocytosis, it was 26.7 this morning Potassium has been repleted, recheck in the morning Continue IV fluid resuscitation given relative hypoeuvolemia and hyponatremia After stabilization, plan is to transition to p.o. Augmentin for a 7-day course and follow-up with general surgery as an outpatient.  Will need outpatient colonoscopy for 6 weeks after resolution of symptoms per their recommendations.
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
## 2023-03-24 NOTE — H&P (Addendum)
History and Physical  Nicholas Moody:[id number] DOB: 1956-03-29 DOA: 03/23/2023  Referring physician: Dr. Fransico Meadow, EDP  PCP: Elease Etienne Bonna Gains, NP  Outpatient Specialists: GI. Patient coming from: Home.  Chief Complaint: Abdominal pain  HPI: Nicholas Moody is a 67 y.o. male with medical history significant for hypertension, hyperlipidemia, hypothyroidism, who initially presented to urgent care due to right lower quadrant abdominal pain.  Associated with poor oral intake and constipation x 1 week.  No reported subjective fevers or chills.  He presented to the ER via EMS.  In the ED, tachycardic and tachypneic.  CT scan abdomen pelvis with contrast with findings consistent with acute appendicitis with with small periappendiceal abscess measuring 2 x 0.9 x 0.9 cm.  EDP discussed the case with general surgery.  Recommended conservative management with IV antibiotics for now.  General surgery following consultation. Admitted by [facility], hospitalist service.   ED Course: Temperature 98.7.  BP 129/87, pulse 107, respiratory rate 24.  O2 saturation 100% on room air.  Lab studies notable for serum sodium 131, potassium 3.3, serum bicarb 20, glucose 115, anion gap 16.  WBC 25.2.  Review of Systems: Review of systems as noted in the HPI. All other systems reviewed and are negative.   Past Medical History:  Diagnosis Date   Advanced care planning/counseling discussion 05/30/2021   Allergy    Anxiety    Asthma    Cancer (HCC)    skin cancer x 2   Depression    History of renal stone 2011   lithotripsy   Hydrocele of testis    Hypercholesteremia 08/07/2022   Hypertension    Scoliosis    sees chiropractor monthly   Scoliosis    Thyroid disease    Past Surgical History:  Procedure Laterality Date   COLONOSCOPY     HYDROCELE EXCISION  10/19/2011   Procedure: HYDROCELECTOMY ADULT;  Surgeon: Anner Crete, MD;  Location: [facility];  Service: Urology;  Laterality: Left;   left hydrocelectomy , left epididectomy    LITHOTRIPSY     x2   LITHOTRIPSY Right    Done twice in 2010   MOHS SURGERY     VASECTOMY      Social History:  reports that he has never smoked. He has never used smokeless tobacco. He reports that he does not currently use alcohol. He reports current drug use. Drug: Marijuana.   Allergies  Allergen Reactions   Lactose Intolerance (Gi) Diarrhea and Nausea Only    Family History  Problem Relation Age of Onset   Hypertension Mother    Heart disease Mother    Cancer Mother        bone   Rectal cancer Father    Stomach cancer Father    Colon cancer Father    Hypertension Father    Cancer Father        bone and colon   Kidney failure Father    Alcohol abuse Father    Anxiety disorder Father    Early death Father    Colon polyps Neg Hx    Crohn's disease Neg Hx    Esophageal cancer Neg Hx       Prior to Admission medications   Medication Sig Start Date End Date Taking? Authorizing Provider  levothyroxine (SYNTHROID) 125 MCG tablet TAKE ONE TABLET BY MOUTH ONE TIME DAILY 03/08/23   Nche, Bonna Gains, NP  lisinopril (ZESTRIL) 20 MG tablet TAKE ONE TABLET BY MOUTH ONE TIME DAILY 12/27/22  Nche, Bonna Gains, NP  sildenafil (VIAGRA) 100 MG tablet TAKE ONE TABLET BY MOUTH ONE TIME DAILY AS NEEDED 06/05/22   Nche, Bonna Gains, NP    Physical Exam: BP 129/85 (BP Location: Right Arm)   Pulse (!) 104   Temp 98.7 F (37.1 C) (Oral)   Resp 19   Ht 5\' 7"  (1.702 m)   Wt 79.4 kg   SpO2 100%   BMI 27.42 kg/m   General: 67 y.o. year-old male well developed well nourished in no acute distress.  Alert and oriented x3. Cardiovascular: Regular rate and rhythm with no rubs or gallops.  No thyromegaly or JVD noted.  Trace lower extremity edema. 2/4 pulses in all 4 extremities. Respiratory: Clear to auscultation with no wheezes or rales. Good inspiratory effort. Abdomen: Soft nontender nondistended with normal bowel sounds x4  quadrants. Muskuloskeletal: No cyanosis, clubbing or edema noted bilaterally Neuro: CN II-XII intact, strength, sensation, reflexes Skin: No ulcerative lesions noted or rashes Psychiatry: Judgement and insight appear normal. Mood is appropriate for condition and setting          Labs on Admission:  Basic Metabolic Panel: Recent Labs  Lab 03/23/23 1835  NA 131*  K 3.3*  CL 95*  CO2 20*  GLUCOSE 115*  BUN 14  CREATININE 1.11  CALCIUM 9.3   Liver Function Tests: Recent Labs  Lab 03/23/23 1835  AST 33  ALT 22  ALKPHOS 65  BILITOT 1.1  PROT 7.4  ALBUMIN 4.1   Recent Labs  Lab 03/23/23 1835  LIPASE 20   No results for input(s): "AMMONIA" in the last 168 hours. CBC: Recent Labs  Lab 03/23/23 1835  WBC 25.2*  HGB 14.0  HCT 39.0  MCV 90.5  PLT 301   Cardiac Enzymes: No results for input(s): "CKTOTAL", "CKMB", "CKMBINDEX", "TROPONINI" in the last 168 hours.  BNP (last 3 results) No results for input(s): "BNP" in the last 8760 hours.  ProBNP (last 3 results) No results for input(s): "PROBNP" in the last 8760 hours.  CBG: No results for input(s): "GLUCAP" in the last 168 hours.  Radiological Exams on Admission: CT ABDOMEN PELVIS W CONTRAST  Result Date: 03/23/2023 CLINICAL DATA:  Right lower quadrant abdominal pain. EXAM: CT ABDOMEN AND PELVIS WITH CONTRAST TECHNIQUE: Multidetector CT imaging of the abdomen and pelvis was performed using the standard protocol following bolus administration of intravenous contrast. RADIATION DOSE REDUCTION: This exam was performed according to the departmental dose-optimization program which includes automated exposure control, adjustment of the mA and/or kV according to patient size and/or use of iterative reconstruction technique. CONTRAST:  75mL OMNIPAQUE IOHEXOL 350 MG/ML SOLN COMPARISON:  CT 10/24/2009 FINDINGS: Lower chest: Subsegmental atelectasis or scarring in the left lower lobe. No pleural effusion. Hepatobiliary: 3.2 cm  cyst in the right lobe of the liver. There additional scattered small subcentimeter hypodensities are too small to characterize. No suspicious liver lesion. Gallbladder physiologically distended, no calcified stone. No biliary dilatation. Pancreas: No ductal dilatation or inflammation. Spleen: Normal in size without focal abnormality. Adrenals/Urinary Tract: No suspicious adrenal nodule. No hydronephrosis or perinephric edema. Homogeneous renal enhancement with symmetric excretion on delayed phase imaging. Nonobstructing stones in the lower pole of the right kidney, largest 8 mm. There are bilateral renal cysts. No further follow-up imaging is recommended. Urinary bladder is physiologically distended. Mild wall thickening about the right aspect of the bladder likely reactive. Stomach/Bowel: Inflammatory process in the right lower quadrant. Inflamed tubular structure measuring 8 mm, coronal series 6, image  48 and series 3, image 61, likely represents an inflamed appendix. Just distal to this is a small peripherally enhancing fluid collection measuring 2 x 0.9 x 0.9 cm series 6, image 47 and series 3, image 56, suspicious for small periappendiceal abscess. There is marked fat stranding in the right lower quadrant as well as wall thickening of the cecum. Small amounts of right lower quadrant non organized free fluid. Mild wall thickening about the terminal ileum also seen. Distal ileal small bowel is fluid-filled and mildly prominent, likely reactive. No obstruction. Small volume of colonic stool. Left colonic diverticulosis. No focal diverticulitis. No free air. Vascular/Lymphatic: Aortic atherosclerosis. No aneurysm. The portal vein is patent. Small ileocolic nodes typically reactive. Reproductive: Prominent prostate spans 5.8 cm transverse. Other: Inflammatory process in the right lower quadrant with fat stranding and small amount of non organized free fluid. Small fluid collection as described. There is no free  air. No abdominal wall hernia. Musculoskeletal: Lower lumbar facet hypertrophy. There are no acute or suspicious osseous abnormalities. No intramuscular collection. IMPRESSION: 1. Findings consistent with acute appendicitis with small periappendiceal abscess measuring 2 x 0.9 x 0.9 cm. There is marked inflammation in the right lower quadrant. Wall thickening of the cecum and distal ileum likely reactive. 2. Nonobstructing right nephrolithiasis. 3. Colonic diverticulosis without focal diverticulitis. 4. Enlarged prostate. Aortic Atherosclerosis (ICD10-I70.0). Electronically Signed   By: Narda Rutherford M.D.   On: 03/23/2023 22:13    EKG: I independently viewed the EKG done and my findings are as followed: None available at the time of this visit.  Assessment/Plan Present on Admission:  Acute appendicitis  Principal Problem:   Acute appendicitis  Sepsis secondary to acute appendicitis with periappendiceal abscess, POA Presented with leukocytosis 25.2K, tachycardia, tachypnea Obtain peripheral blood cultures x 2 IV fluid hydration per sepsis protocol Continue Zosyn Monitor fever curve and WBC  Mild anion gap metabolic acidosis Serum bicarb 20, anion gap 16 Obtain lactic acid level Continue IV fluid, held off 2 L IV fluid bolus per sepsis protocol due to concern for developing volume overload.  Received 500 cc IV fluid bolus with maintenance IV fluid 150 cc/h x 20 hours.  Hypokalemia Serum potassium 3.3 Repleted orally Repeat chemistry panel and obtain magnesium level  Mild hypovolemic hyponatremia Serum sodium 131 Continue IV fluid Repeat chemistry panel in the morning  Hypothyroidism Resume home levothyroxine   Time: 75 minutes.   DVT prophylaxis: Subcu heparin 3 times daily  Code Status: Full code  Family Communication: None at bedside  Disposition Plan: Admitted to telemetry surgical unit  Consults called: General Surgery  Admission status: Inpatient  status.   Status is: Inpatient The patient requires at least 2 midnights for further evaluation and treatment of present condition.   Darlin Drop MD [facility] Pager [phone]  If 7PM-7AM, please contact night-coverage [email or web address] Password [facility] [place]  03/24/2023, 5:38 AM

## 2023-03-25 DIAGNOSIS — K3589 Other acute appendicitis without perforation or gangrene: Secondary | ICD-10-CM | POA: Diagnosis not present

## 2023-03-25 LAB — BASIC METABOLIC PANEL
Anion gap: 13 (ref 5–15)
BUN: 13 mg/dL (ref 8–23)
CO2: 25 mmol/L (ref 22–32)
Calcium: 8.6 mg/dL — ABNORMAL LOW (ref 8.9–10.3)
Chloride: 95 mmol/L — ABNORMAL LOW (ref 98–111)
Creatinine, Ser: 1.07 mg/dL (ref 0.61–1.24)
GFR, Estimated: >60 mL/min (ref >60)
Glucose, Bld: 126 mg/dL — ABNORMAL HIGH (ref 70–99)
Potassium: 3.6 mmol/L (ref 3.5–5.1)
Sodium: 133 mmol/L — ABNORMAL LOW (ref 135–145)

## 2023-03-25 LAB — CBC WITH DIFFERENTIAL/PLATELET
Abs Immature Granulocytes: 0.12 10*3/uL — ABNORMAL HIGH (ref 0.00–0.07)
Basophils Absolute: 0 10*3/uL (ref 0.0–0.1)
Basophils Relative: 0 %
Eosinophils Absolute: 0 10*3/uL (ref 0.0–0.5)
Eosinophils Relative: 0 %
HCT: 41.9 % (ref 39.0–52.0)
Hemoglobin: 15.1 g/dL (ref 13.0–17.0)
Immature Granulocytes: 1 %
Lymphocytes Relative: 5 %
Lymphs Abs: 1.1 10*3/uL (ref 0.7–4.0)
MCH: 32.8 pg (ref 26.0–34.0)
MCHC: 36 g/dL (ref 30.0–36.0)
MCV: 90.9 fL (ref 80.0–100.0)
Monocytes Absolute: 1.5 10*3/uL — ABNORMAL HIGH (ref 0.1–1.0)
Monocytes Relative: 7 %
Neutro Abs: 18.7 10*3/uL — ABNORMAL HIGH (ref 1.7–7.7)
Neutrophils Relative %: 87 %
Platelets: 309 10*3/uL (ref 150–400)
RBC: 4.61 MIL/uL (ref 4.22–5.81)
RDW: 11.8 % (ref 11.5–15.5)
WBC: 21.5 10*3/uL — ABNORMAL HIGH (ref 4.0–10.5)
nRBC: 0 % (ref 0.0–0.2)

## 2023-03-25 LAB — URINALYSIS, ROUTINE W REFLEX MICROSCOPIC
Bilirubin Urine: NEGATIVE
Glucose, UA: 50 mg/dL — AB
Ketones, ur: 80 mg/dL — AB
Leukocytes,Ua: NEGATIVE
Nitrite: NEGATIVE
Protein, ur: 100 mg/dL — AB
Specific Gravity, Urine: 1.03 (ref 1.005–1.030)
pH: 5 (ref 5.0–8.0)

## 2023-03-25 LAB — MAGNESIUM: Magnesium: 1.9 mg/dL (ref 1.7–2.4)

## 2023-03-25 MED ORDER — ALUM & MAG HYDROXIDE-SIMETH 200-200-20 MG/5ML PO SUSP
15.0000 mL | Freq: Four times a day (QID) | ORAL | Status: DC | PRN
  Administered 2023-03-25 (×2): 15 mL via ORAL
  Filled 2023-03-25 (×2): qty 30

## 2023-03-25 MED ORDER — LISINOPRIL 20 MG PO TABS
20.0000 mg | ORAL_TABLET | Freq: Every day | ORAL | Status: DC
  Administered 2023-03-25 – 2023-03-29 (×5): 20 mg via ORAL
  Filled 2023-03-25 (×5): qty 1

## 2023-03-25 MED ORDER — PANTOPRAZOLE SODIUM 40 MG PO TBEC
40.0000 mg | DELAYED_RELEASE_TABLET | Freq: Every day | ORAL | Status: DC
  Administered 2023-03-25 – 2023-03-29 (×5): 40 mg via ORAL
  Filled 2023-03-25 (×5): qty 1

## 2023-03-25 MED ORDER — LISINOPRIL 20 MG PO TABS: 20.0000 mg | ORAL_TABLET | Freq: Every day | ORAL | Status: DC

## 2023-03-25 NOTE — Plan of Care (Signed)
  Problem: Clinical Measurements: Goal: Respiratory complications will improve Outcome: Progressing Goal: Cardiovascular complication will be avoided Outcome: Progressing   Problem: Activity: Goal: Risk for activity intolerance will decrease Outcome: Progressing   Problem: Elimination: Goal: Will not experience complications related to bowel motility Outcome: Progressing Goal: Will not experience complications related to urinary retention Outcome: Progressing

## 2023-03-25 NOTE — Progress Notes (Signed)
  Subjective No acute events. Steadily improving, he reports he is concerned about BP as it has been high. Some intermittent nausea, no emesis. Passing flatus. No BM  Objective: Vital signs in last 24 hours: Temp:  [98.3 F (36.8 C)-99.2 F (37.3 C)] 98.3 F (36.8 C) (10/06 0804) Pulse Rate:  [83-110] 110 (10/06 0804) Resp:  [16-18] 16 (10/06 0804) BP: (126-191)/(75-117) 162/107 (10/06 0804) SpO2:  [96 %-98 %] 98 % (10/06 0804) Last BM Date : 03/23/23  Intake/Output from previous day: 10/05 0701 - 10/06 0700 In: 3917.9 [P.O.:1340; I.V.:2419.6; IV Piggyback:158.2] Out: 0  Intake/Output this shift: Total I/O In: 220 [P.O.:220] Out: -   Gen: NAD, comfortable, sleeping when I arrived  CV: RRR Pulm: Normal work of breathing Abd: Very soft, mildly ttp in RLQ; no rebound nor guarding; no tenderness elsewhere. Nondistended Ext: SCDs in place  Lab Results: CBC  Recent Labs    03/24/23 0646 03/25/23 0644  WBC 26.7* 21.5*  HGB 12.9* 15.1  HCT 35.5* 41.9  PLT 277 309   BMET Recent Labs    03/24/23 0646 03/25/23 0644  NA 132* 133*  K 3.2* 3.6  CL 96* 95*  CO2 21* 25  GLUCOSE 115* 126*  BUN 14 13  CREATININE 1.17 1.07  CALCIUM 8.7* 8.6*   PT/INR Recent Labs    03/24/23 0646  LABPROT 15.1  INR 1.2   ABG No results for input(s): "PHART", "HCO3" in the last 72 hours.  Invalid input(s): "PCO2", "PO2"  Studies/Results:  Anti-infectives: Anti-infectives (From admission, onward)    Start     Dose/Rate Route Frequency Ordered Stop   03/24/23 0645  piperacillin-tazobactam (ZOSYN) IVPB 3.375 g        3.375 g 12.5 mL/hr over 240 Minutes Intravenous Every 8 hours 03/24/23 0547     03/23/23 2245  piperacillin-tazobactam (ZOSYN) IVPB 3.375 g        3.375 g 100 mL/hr over 30 Minutes Intravenous  Once 03/23/23 2235 03/23/23 2323        Assessment/Plan: Patient Active Problem List   Diagnosis Date Noted   Acute appendicitis 03/24/2023   Hearing loss 03/08/2023    Sensorineural hearing loss (SNHL) of both ears 08/07/2022   Hypertriglyceridemia 08/07/2022   Advanced care planning/counseling discussion 05/30/2021   Patient is full code 05/30/2021   Flat foot 04/17/2021   Leg length discrepancy 04/17/2021   GAD (generalized anxiety disorder) 03/23/2021   Hypertension 03/23/2021   Hypothyroidism 03/23/2021   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 male 03/23/2021   Idiopathic scoliosis and kyphoscoliosis 03/21/2010   NEPHROLITHIASIS, HX OF 03/21/2010   Appendicitis with abscess   Appendicitis with abscess - recommend non-operative management at this point. Cont IV zosyn  Abscess too small for IR drain, trend fever/WBC/symptoms - clinically improving - WBC 21 from 25, afebrile, hypertensive  PO BP meds per TRH  FEN - continue clears today DVT - SCDs, LMWH Dispo -  continue to monitor here   LOS: 1 day   I spent a total of 35 minutes in both face-to-face and non-face-to-face activities, excluding procedures performed, for this visit on the date of this encounter.  Marin Olp, MD [facility], [facility]

## 2023-03-25 NOTE — Progress Notes (Addendum)
PROGRESS NOTE    Nicholas Moody  UJW:[id number]  DOB: 1955/12/19  DOA: 03/23/2023 PCP: Anne Ng, NP Outpatient Specialists:   Hospital course:  67 year old man with HTN who was admitted earlier today for acute appendicitis with 2X 1 cm periappendiceal abscess. Patient was seen by general surgery who are recommending conservative management with IV antibiotics. Abscess is thought to be too small for drainage. Patient was placed on Zosyn with transition over to p.o. Augmentin when patient able to take p.o.'s. Plan is for outpatient colonoscopy 4 to 6 weeks after resolution of symptoms.   Subjective:  Patient states that abdominal pain continues to improve however is still persistent but much less than on admission   Objective: Vitals:   03/25/23 0414 03/25/23 0528 03/25/23 0804 03/25/23 1606  BP: (!) 191/102 (!) 165/117 (!) 162/107 (!) 156/102  Pulse: 88 (!) 102 (!) 110 99  Resp:   16 17  Temp: 98.3 F (36.8 C)  98.3 F (36.8 C) 98.4 F (36.9 C)  TempSrc: Oral     SpO2: 96%  98% 98%  Weight:      Height:        Intake/Output Summary (Last 24 hours) at 03/25/2023 1833 Last data filed at 03/25/2023 1705 Gross per 24 hour  Intake 1819.81 ml  Output --  Net 1819.81 ml   Filed Weights   03/23/23 1827 03/24/23 0428  Weight: 82.1 kg 79.4 kg     Exam:  General: Patient appearing older than stated age lying in bed in NAD Eyes: sclera anicteric, conjuctiva mild injection bilaterally CVS: S1-S2, regular  Respiratory:  decreased air entry bilaterally secondary to decreased inspiratory effort, rales at bases  GI: He does have normoactive bowel sounds, he does have moderate TTP at LLQ and RLQ without rebound tenderness, no psoas sign LE: Warm and well-perfused Neuro: A/O x 3,  grossly nonfocal.  Psych: patient is logical and coherent, judgement and insight appear normal, mood and affect appropriate to situation.  Data Reviewed:  Basic Metabolic  Panel: Recent Labs  Lab 03/23/23 1835 03/24/23 0646 03/25/23 0644  NA 131* 132* 133*  K 3.3* 3.2* 3.6  CL 95* 96* 95*  CO2 20* 21* 25  GLUCOSE 115* 115* 126*  BUN 14 14 13   CREATININE 1.11 1.17 1.07  CALCIUM 9.3 8.7* 8.6*  MG  --  1.7 1.9  PHOS  --  2.6  --     CBC: Recent Labs  Lab 03/23/23 1835 03/24/23 0646 03/25/23 0644  WBC 25.2* 26.7* 21.5*  NEUTROABS  --  22.4* 18.7*  HGB 14.0 12.9* 15.1  HCT 39.0 35.5* 41.9  MCV 90.5 90.3 90.9  PLT 301 277 309     Scheduled Meds:  heparin  5,000 Units Subcutaneous Q8H   levothyroxine  125 mcg Oral Q0600   lisinopril  20 mg Oral Daily   pantoprazole  40 mg Oral Daily   Continuous Infusions:  piperacillin-tazobactam 3.375 g (03/25/23 1342)     Assessment & Plan:   Acute perforated appendicitis with 2 cm abscess Patient with decreased abdominal pain, mild improvement in leukocytosis, remains afebrile Followed by general surgery, abscess thought to be too small for IR drain Continue Zosyn, clear liquid diet Patient will need outpatient colonoscopy 6 weeks after resolution  Hypokalemia Resolved with repletion  Metabolic acidosis Resolved with treatment of infection and fluid resuscitation  HTN Patient's home doses of lisinopril restarted of symptoms  Hypothyroidism Continue Synthroid    DVT prophylaxis: Subcu heparin  Code Status: Full Family Communication: None today     Studies: CT ABDOMEN PELVIS W CONTRAST  Result Date: 03/23/2023 CLINICAL DATA:  Right lower quadrant abdominal pain. EXAM: CT ABDOMEN AND PELVIS WITH CONTRAST TECHNIQUE: Multidetector CT imaging of the abdomen and pelvis was performed using the standard protocol following bolus administration of intravenous contrast. RADIATION DOSE REDUCTION: This exam was performed according to the departmental dose-optimization program which includes automated exposure control, adjustment of the mA and/or kV according to patient size and/or use of  iterative reconstruction technique. CONTRAST:  75mL OMNIPAQUE IOHEXOL 350 MG/ML SOLN COMPARISON:  CT 10/24/2009 FINDINGS: Lower chest: Subsegmental atelectasis or scarring in the left lower lobe. No pleural effusion. Hepatobiliary: 3.2 cm cyst in the right lobe of the liver. There additional scattered small subcentimeter hypodensities are too small to characterize. No suspicious liver lesion. Gallbladder physiologically distended, no calcified stone. No biliary dilatation. Pancreas: No ductal dilatation or inflammation. Spleen: Normal in size without focal abnormality. Adrenals/Urinary Tract: No suspicious adrenal nodule. No hydronephrosis or perinephric edema. Homogeneous renal enhancement with symmetric excretion on delayed phase imaging. Nonobstructing stones in the lower pole of the right kidney, largest 8 mm. There are bilateral renal cysts. No further follow-up imaging is recommended. Urinary bladder is physiologically distended. Mild wall thickening about the right aspect of the bladder likely reactive. Stomach/Bowel: Inflammatory process in the right lower quadrant. Inflamed tubular structure measuring 8 mm, coronal series 6, image 48 and series 3, image 61, likely represents an inflamed appendix. Just distal to this is a small peripherally enhancing fluid collection measuring 2 x 0.9 x 0.9 cm series 6, image 47 and series 3, image 56, suspicious for small periappendiceal abscess. There is marked fat stranding in the right lower quadrant as well as wall thickening of the cecum. Small amounts of right lower quadrant non organized free fluid. Mild wall thickening about the terminal ileum also seen. Distal ileal small bowel is fluid-filled and mildly prominent, likely reactive. No obstruction. Small volume of colonic stool. Left colonic diverticulosis. No focal diverticulitis. No free air. Vascular/Lymphatic: Aortic atherosclerosis. No aneurysm. The portal vein is patent. Small ileocolic nodes typically  reactive. Reproductive: Prominent prostate spans 5.8 cm transverse. Other: Inflammatory process in the right lower quadrant with fat stranding and small amount of non organized free fluid. Small fluid collection as described. There is no free air. No abdominal wall hernia. Musculoskeletal: Lower lumbar facet hypertrophy. There are no acute or suspicious osseous abnormalities. No intramuscular collection. IMPRESSION: 1. Findings consistent with acute appendicitis with small periappendiceal abscess measuring 2 x 0.9 x 0.9 cm. There is marked inflammation in the right lower quadrant. Wall thickening of the cecum and distal ileum likely reactive. 2. Nonobstructing right nephrolithiasis. 3. Colonic diverticulosis without focal diverticulitis. 4. Enlarged prostate. Aortic Atherosclerosis (ICD10-I70.0). Electronically Signed   By: Narda Rutherford M.D.   On: 03/23/2023 22:13    Principal Problem:   Acute appendicitis     Pieter Partridge, [facility]  If 7PM-7AM, please contact night-coverage [email or web address]   LOS: 1 day

## 2023-03-26 ENCOUNTER — Inpatient Hospital Stay (HOSPITAL_COMMUNITY): Payer: Medicare Other

## 2023-03-26 DIAGNOSIS — K3532 Acute appendicitis with perforation and localized peritonitis, without abscess: Secondary | ICD-10-CM | POA: Diagnosis not present

## 2023-03-26 LAB — CBC WITH DIFFERENTIAL/PLATELET
Abs Immature Granulocytes: 0.07 10*3/uL (ref 0.00–0.07)
Basophils Absolute: 0 10*3/uL (ref 0.0–0.1)
Basophils Relative: 0 %
Eosinophils Absolute: 0 10*3/uL (ref 0.0–0.5)
Eosinophils Relative: 0 %
HCT: 38.4 % — ABNORMAL LOW (ref 39.0–52.0)
Hemoglobin: 14 g/dL (ref 13.0–17.0)
Immature Granulocytes: 0 %
Lymphocytes Relative: 8 %
Lymphs Abs: 1.5 10*3/uL (ref 0.7–4.0)
MCH: 33.2 pg (ref 26.0–34.0)
MCHC: 36.5 g/dL — ABNORMAL HIGH (ref 30.0–36.0)
MCV: 91 fL (ref 80.0–100.0)
Monocytes Absolute: 1.7 10*3/uL — ABNORMAL HIGH (ref 0.1–1.0)
Monocytes Relative: 9 %
Neutro Abs: 15.3 10*3/uL — ABNORMAL HIGH (ref 1.7–7.7)
Neutrophils Relative %: 83 %
Platelets: 332 10*3/uL (ref 150–400)
RBC: 4.22 MIL/uL (ref 4.22–5.81)
RDW: 11.6 % (ref 11.5–15.5)
WBC: 18.6 10*3/uL — ABNORMAL HIGH (ref 4.0–10.5)
nRBC: 0 % (ref 0.0–0.2)

## 2023-03-26 LAB — BASIC METABOLIC PANEL
Anion gap: 12 (ref 5–15)
BUN: 15 mg/dL (ref 8–23)
CO2: 25 mmol/L (ref 22–32)
Calcium: 8.3 mg/dL — ABNORMAL LOW (ref 8.9–10.3)
Chloride: 91 mmol/L — ABNORMAL LOW (ref 98–111)
Creatinine, Ser: 0.98 mg/dL (ref 0.61–1.24)
GFR, Estimated: >60 mL/min (ref >60)
Glucose, Bld: 111 mg/dL — ABNORMAL HIGH (ref 70–99)
Potassium: 3.2 mmol/L — ABNORMAL LOW (ref 3.5–5.1)
Sodium: 128 mmol/L — ABNORMAL LOW (ref 135–145)

## 2023-03-26 MED ORDER — IOHEXOL 350 MG/ML SOLN
75.0000 mL | Freq: Once | INTRAVENOUS | Status: AC | PRN
  Administered 2023-03-26: 75 mL via INTRAVENOUS

## 2023-03-26 MED ORDER — IOHEXOL 9 MG/ML PO SOLN
500.0000 mL | ORAL | Status: AC
  Administered 2023-03-26 (×2): 500 mL via ORAL

## 2023-03-26 MED ORDER — LABETALOL HCL 5 MG/ML IV SOLN: 10.0000 mg | INTRAVENOUS | Status: DC | PRN

## 2023-03-26 MED ORDER — POTASSIUM CHLORIDE CRYS ER 20 MEQ PO TBCR
40.0000 meq | EXTENDED_RELEASE_TABLET | ORAL | Status: AC
  Administered 2023-03-26 (×2): 40 meq via ORAL
  Filled 2023-03-26 (×2): qty 2

## 2023-03-26 NOTE — Plan of Care (Signed)
  Problem: Education: Goal: Knowledge of General Education information will improve Description: Including pain rating scale, medication(s)/side effects and non-pharmacologic comfort measures Outcome: Completed/Met

## 2023-03-26 NOTE — Progress Notes (Signed)
   Subjective/Chief Complaint: More pain today RLQ   Objective: Vital signs in last 24 hours: Temp:  [98.3 F (36.8 C)-98.6 F (37 C)] 98.6 F (37 C) (10/07 0509) Pulse Rate:  [95-108] 108 (10/07 0509) Resp:  [17-20] 18 (10/07 0509) BP: (145-160)/(94-102) 160/99 (10/07 0509) SpO2:  [98 %-99 %] 99 % (10/07 0509) Last BM Date : 03/23/23  Intake/Output from previous day: 10/06 0701 - 10/07 0700 In: 1689.1 [P.O.:1500; IV Piggyback:189.1] Out: 0  Intake/Output this shift: No intake/output data recorded.  Abdomen: Tender RLQ  Lab Results:  Recent Labs    03/24/23 0646 03/25/23 0644  WBC 26.7* 21.5*  HGB 12.9* 15.1  HCT 35.5* 41.9  PLT 277 309   BMET Recent Labs    03/24/23 0646 03/25/23 0644  NA 132* 133*  K 3.2* 3.6  CL 96* 95*  CO2 21* 25  GLUCOSE 115* 126*  BUN 14 13  CREATININE 1.17 1.07  CALCIUM 8.7* 8.6*   PT/INR Recent Labs    03/24/23 0646  LABPROT 15.1  INR 1.2   ABG No results for input(s): "PHART", "HCO3" in the last 72 hours.  Invalid input(s): "PCO2", "PO2"  Studies/Results: No results found.  Anti-infectives: Anti-infectives (From admission, onward)    Start     Dose/Rate Route Frequency Ordered Stop   03/24/23 0645  piperacillin-tazobactam (ZOSYN) IVPB 3.375 g        3.375 g 12.5 mL/hr over 240 Minutes Intravenous Every 8 hours 03/24/23 0547     03/23/23 2245  piperacillin-tazobactam (ZOSYN) IVPB 3.375 g        3.375 g 100 mL/hr over 30 Minutes Intravenous  Once 03/23/23 2235 03/23/23 2323       Assessment/Plan: Appendicitis with abscess   Appendicitis with abscess - more pain today  Check CT for abscess progression   Cont IV zosyn  check CBC    PO BP meds per TRH   FEN - continue clears today DVT - SCDs, LMWH Dispo -  continue to monitor here   LOS: 2 days    Dortha Schwalbe MD  03/26/2023 Moderate complexity

## 2023-03-26 NOTE — Progress Notes (Signed)
PROGRESS NOTE    Nicholas Moody  WGN:[id number]  DOB: 01-07-1956  DOA: 03/23/2023 PCP: Nicholas Ng, NP Outpatient Specialists:   Hospital course:  67 year old man with HTN who was admitted for acute appendicitis with 2X 1 cm periappendiceal abscess. Patient was seen by general surgery who are recommending conservative management with IV antibiotics. Abscess is thought to be too small for drainage.  Patient remains in the hospital.  On IV antibiotics.  Persistent pain.  WBC somehow improving.  Repeat scans today.  Subjective:  Patient seen and examined.  Wife at the bedside.  Patient tells me that his pain is neither better nor worse.  He is very worried about it.  Denies any nausea vomiting.  Blood pressures are better and he was happy.  Scheduled for repeat CT scan today.   Objective: Vitals:   03/25/23 1606 03/25/23 2218 03/26/23 0509 03/26/23 0920  BP: (!) 156/102 (!) 145/94 (!) 160/99 (!) 183/94  Pulse: 99 95 (!) 108 100  Resp: 17 20 18 18   Temp: 98.4 F (36.9 C) 98.3 F (36.8 C) 98.6 F (37 C) 98.5 F (36.9 C)  TempSrc:  Oral    SpO2: 98% 99% 99% 97%  Weight:      Height:        Intake/Output Summary (Last 24 hours) at 03/26/2023 1406 Last data filed at 03/26/2023 1251 Gross per 24 hour  Intake 1589.07 ml  Output 0 ml  Net 1589.07 ml   Filed Weights   03/23/23 1827 03/24/23 0428  Weight: 82.1 kg 79.4 kg     Exam:  General: Looks comfortable. Cardiovascular: S1-S2 normal.  Regular rate rhythm Respiratory: Bilateral clear.  No added sounds Gastrointestinal: Soft.  Tenderness right lower quadrant.  Bowel sound present. Ext: No edema. Neuro: Alert awake and oriented. Musculoskeletal: No deformities.    Data Reviewed:  Basic Metabolic Panel: Recent Labs  Lab 03/23/23 1835 03/24/23 0646 03/25/23 0644 03/26/23 0902  NA 131* 132* 133* 128*  K 3.3* 3.2* 3.6 3.2*  CL 95* 96* 95* 91*  CO2 20* 21* 25 25  GLUCOSE 115* 115* 126* 111*  BUN  14 14 13 15   CREATININE 1.11 1.17 1.07 0.98  CALCIUM 9.3 8.7* 8.6* 8.3*  MG  --  1.7 1.9  --   PHOS  --  2.6  --   --     CBC: Recent Labs  Lab 03/23/23 1835 03/24/23 0646 03/25/23 0644 03/26/23 0902  WBC 25.2* 26.7* 21.5* 18.6*  NEUTROABS  --  22.4* 18.7* 15.3*  HGB 14.0 12.9* 15.1 14.0  HCT 39.0 35.5* 41.9 38.4*  MCV 90.5 90.3 90.9 91.0  PLT 301 277 309 332     Scheduled Meds:  heparin  5,000 Units Subcutaneous Q8H   levothyroxine  125 mcg Oral Q0600   lisinopril  20 mg Oral Daily   pantoprazole  40 mg Oral Daily   potassium chloride  40 mEq Oral Q4H   Continuous Infusions:  piperacillin-tazobactam 3.375 g (03/26/23 1354)     Assessment & Plan:   Acute perforated appendicitis with localized abscess On clear liquid diet.  Remains on IV Zosyn.  Adequate pain medications.  WBC trending down.  Repeat CT scan planned today by surgery.  Hypokalemia Replace further today.  Metabolic acidosis Resolved with treatment of infection and fluid resuscitation  HTN Blood pressure is better after starting back on home lisinopril.  Labetalol as needed.  Hypothyroidism Continue Synthroid    DVT prophylaxis: Subcu heparin Code  Status: Full Family Communication: Wife at the bedside.     Studies: No results found.  Principal Problem:   Acute appendicitis     Heather Streeper,    LOS: 2 days

## 2023-03-27 DIAGNOSIS — K3532 Acute appendicitis with perforation and localized peritonitis, without abscess: Secondary | ICD-10-CM | POA: Diagnosis not present

## 2023-03-27 LAB — BASIC METABOLIC PANEL
Anion gap: 9 (ref 5–15)
BUN: 15 mg/dL (ref 8–23)
CO2: 25 mmol/L (ref 22–32)
Calcium: 8.2 mg/dL — ABNORMAL LOW (ref 8.9–10.3)
Chloride: 95 mmol/L — ABNORMAL LOW (ref 98–111)
Creatinine, Ser: 1.02 mg/dL (ref 0.61–1.24)
GFR, Estimated: >60 mL/min (ref >60)
Glucose, Bld: 102 mg/dL — ABNORMAL HIGH (ref 70–99)
Potassium: 3.9 mmol/L (ref 3.5–5.1)
Sodium: 129 mmol/L — ABNORMAL LOW (ref 135–145)

## 2023-03-27 LAB — CBC
HCT: 34.8 % — ABNORMAL LOW (ref 39.0–52.0)
Hemoglobin: 12.7 g/dL — ABNORMAL LOW (ref 13.0–17.0)
MCH: 32.7 pg (ref 26.0–34.0)
MCHC: 36.5 g/dL — ABNORMAL HIGH (ref 30.0–36.0)
MCV: 89.7 fL (ref 80.0–100.0)
Platelets: 337 10*3/uL (ref 150–400)
RBC: 3.88 MIL/uL — ABNORMAL LOW (ref 4.22–5.81)
RDW: 11.6 % (ref 11.5–15.5)
WBC: 14.7 10*3/uL — ABNORMAL HIGH (ref 4.0–10.5)
nRBC: 0 % (ref 0.0–0.2)

## 2023-03-27 MED ORDER — HEPARIN SODIUM (PORCINE) 5000 UNIT/ML IJ SOLN
5000.0000 [IU] | Freq: Three times a day (TID) | INTRAMUSCULAR | Status: DC
  Administered 2023-03-28 – 2023-04-03 (×18): 5000 [IU] via SUBCUTANEOUS
  Filled 2023-03-27 (×19): qty 1

## 2023-03-27 NOTE — TOC CM/SW Note (Signed)
Transition of Care [facility]) - Inpatient Brief Assessment   Patient Details  Name: Nicholas Moody MRN: [id number] Date of Birth: 1955-08-21  Transition of Care [facility]) CM/SW Contact:    Tom-Johnson, Hershal Coria, RN Phone Number: 03/27/2023, 8:46 AM   Clinical Narrative:   Patient presented to the ED with RLQ Abdominal pain, Poor Appetite, N/V and Diarrhea.  Found to have Appendicitis with Abscess, Gen Sx consulted, non-operative management at this time. Abscess to small to drain. Patient on IV abx.    From home with wife, does not have children. Has one supportive sister who resides in [place].  Has a cane, walker, w/c and shower seat at home.  PCP is Nche, Bonna Gains, NP and uses [facility] on [address].   No TOC needs or recommendations noted at this time.  Patient not Medically ready for discharge.  CM will continue to follow as patient progresses with care towards discharge.      Transition of Care Asessment: Insurance and Status: Insurance coverage has been reviewed Patient has primary care physician: Yes Home environment has been reviewed: Yes Prior level of function:: Modified Independent Prior/Current Home Services: No current home services Social Determinants of Health Reivew: SDOH reviewed no interventions necessary Readmission risk has been reviewed: Yes Transition of care needs: no transition of care needs at this time

## 2023-03-27 NOTE — Care Management Important Message (Signed)
Important Message  Patient Details  Name: Nicholas Moody MRN: [id number] Date of Birth: 04-14-1956   Important Message Given:  Yes - Medicare IM     Dorena Bodo 03/27/2023, 1:46 PM

## 2023-03-27 NOTE — Progress Notes (Signed)
Progress Note     Subjective: Pt reports abdominal pain is overall improving. Denies nausea or vomiting. Having some loose BMs overnight. He is vegan and concerned about what options he would have on liquid diet   Objective: Vital signs in last 24 hours: Temp:  [98.4 F (36.9 C)-99.2 F (37.3 C)] 98.4 F (36.9 C) (10/08 0907) Pulse Rate:  [75-91] 85 (10/08 0907) Resp:  [18-19] 18 (10/08 0907) BP: (147-164)/(82-109) 147/97 (10/08 0907) SpO2:  [98 %-100 %] 100 % (10/08 0907) Last BM Date : 03/27/23  Intake/Output from previous day: 10/07 0701 - 10/08 0700 In: 960 [P.O.:960] Out: 0  Intake/Output this shift: Total I/O In: 199.4 [IV Piggyback:199.4] Out: -   PE: General: pleasant, WD, thin male who is laying in bed in NAD Heart: regular, rate, and rhythm.  Lungs: CTAB, no wheezes, rhonchi, or rales noted.  Respiratory effort nonlabored Abd: soft, mild ttp in RLQ without peritonitis or guarding, ND Psych: A&Ox3 with an appropriate affect.    Lab Results:  Recent Labs    03/26/23 0902 03/27/23 0917  WBC 18.6* 14.7*  HGB 14.0 12.7*  HCT 38.4* 34.8*  PLT 332 337   BMET Recent Labs    03/26/23 0902 03/27/23 0431  NA 128* 129*  K 3.2* 3.9  CL 91* 95*  CO2 25 25  GLUCOSE 111* 102*  BUN 15 15  CREATININE 0.98 1.02  CALCIUM 8.3* 8.2*   PT/INR No results for input(s): "LABPROT", "INR" in the last 72 hours. CMP     Component Value Date/Time   NA 129 (L) 03/27/2023 0431   K 3.9 03/27/2023 0431   CL 95 (L) 03/27/2023 0431   CO2 25 03/27/2023 0431   GLUCOSE 102 (H) 03/27/2023 0431   BUN 15 03/27/2023 0431   CREATININE 1.02 03/27/2023 0431   CALCIUM 8.2 (L) 03/27/2023 0431   PROT 6.4 (L) 03/24/2023 0646   ALBUMIN 3.3 (L) 03/24/2023 0646   AST 24 03/24/2023 0646   ALT 18 03/24/2023 0646   ALKPHOS 56 03/24/2023 0646   BILITOT 1.2 03/24/2023 0646   GFRNONAA >60 03/27/2023 0431   Lipase     Component Value Date/Time   LIPASE 20 03/23/2023 1835        Studies/Results: CT ABDOMEN PELVIS W CONTRAST  Result Date: 03/26/2023 CLINICAL DATA:  Known appendiceal abscess, increasing abdominal pain EXAM: CT ABDOMEN AND PELVIS WITH CONTRAST TECHNIQUE: Multidetector CT imaging of the abdomen and pelvis was performed using the standard protocol following bolus administration of intravenous contrast. RADIATION DOSE REDUCTION: This exam was performed according to the departmental dose-optimization program which includes automated exposure control, adjustment of the mA and/or kV according to patient size and/or use of iterative reconstruction technique. CONTRAST:  75mL OMNIPAQUE IOHEXOL 350 MG/ML SOLN COMPARISON:  03/23/2023 FINDINGS: Lower chest: No acute pleural or parenchymal lung disease. Hepatobiliary: Stable hepatic cysts. High attenuation material within the gallbladder may reflect vicarious excretion of previously administered contrast versus gallbladder sludge. No evidence of calcified gallstones or acute cholecystitis. No biliary duct dilation. Pancreas: Unremarkable. No pancreatic ductal dilatation or surrounding inflammatory changes. Spleen: Normal in size without focal abnormality. Adrenals/Urinary Tract: Stable nonobstructing right renal calculi. Stable bilateral simple appearing renal cortical cysts which do not require specific imaging follow-up. No evidence of hydronephrosis within either kidney. The adrenals are unremarkable. The bladder is minimally distended, limiting its evaluation. Stomach/Bowel: Continued findings of appendicitis, with a dilated inflamed appendix in the right lower quadrant. The small periappendiceal abscess seen previously  is again identified, measuring up to 1 cm reference image 58/3. Increased mesenteric edema within the lower abdomen and pelvis, with interval development of small volume ascites. There has been interval development of a high-grade small bowel obstruction, transition point within the distal  jejunum/proximal ileum right lower quadrant, reference axial images 59-64 of series 3. Maximal diameter of the jejunum measures 4.2 cm. Stable diverticulosis of the distal colon without evidence of acute diverticulitis. Vascular/Lymphatic: Aortic atherosclerosis. No enlarged abdominal or pelvic lymph nodes. Reproductive: Prostate is stable. Other: Small volume ascites within the lower abdomen and pelvis as above. No free intraperitoneal gas. No abdominal wall hernia. Musculoskeletal: No acute or destructive bony abnormalities. Reconstructed images demonstrate no additional findings. IMPRESSION: 1. Persistent findings of acute appendicitis, with small periappendiceal abscess again identified and not appreciably changed in size. 2. Interval development of high-grade small bowel obstruction, transition within the distal jejunum/proximal ileum. 3. Increasing mesenteric edema and small volume ascites within the lower abdomen and pelvis. 4. High density material within the gallbladder consistent with vicarious excretion of contrast versus gallbladder sludge. No evidence of acute cholecystitis. 5. Stable nonobstructing right renal calculi. 6. Stable diverticulosis without diverticulitis. These results will be called to the ordering clinician or representative by the Radiologist Assistant, and communication documented in the PACS or [employer]. Electronically Signed   By: Sharlet Salina M.D.   On: 03/26/2023 19:02    Anti-infectives: Anti-infectives (From admission, onward)    Start     Dose/Rate Route Frequency Ordered Stop   03/24/23 0645  piperacillin-tazobactam (ZOSYN) IVPB 3.375 g        3.375 g 12.5 mL/hr over 240 Minutes Intravenous Every 8 hours 03/24/23 0547     03/23/23 2245  piperacillin-tazobactam (ZOSYN) IVPB 3.375 g        3.375 g 100 mL/hr over 30 Minutes Intravenous  Once 03/23/23 2235 03/23/23 2323        Assessment/Plan Perforated appendicitis with abscess - CT yesterday with small  periappendiceal abscess not significantly changed, possible SBO - discussed with IR and abscess too small for drainage - WBC improving and VSS - pt clinically feeling better today and having some bowel function  - ok to have vegan diet as tolerated and continue abx - AM labs  FEN: vegan diet VTE: SQH ID: [id number] 10/4>>  LOS: 3 days   I reviewed hospitalist notes, last 24 h vitals and pain scores, last 48 h intake and output, last 24 h labs and trends, last 24 h imaging results, and discussed with IR .    Juliet Rude, [facility] 03/27/2023, 10:37 AM Please see Amion for pager number during day hours 7:00am-4:30pm

## 2023-03-27 NOTE — Plan of Care (Signed)
  Problem: Health Behavior/Discharge Planning: Goal: Ability to manage health-related needs will improve Outcome: Progressing   Problem: Clinical Measurements: Goal: Ability to maintain clinical measurements within normal limits will improve Outcome: Progressing Goal: Will remain free from infection Outcome: Progressing Goal: Diagnostic test results will improve Outcome: Progressing Goal: Respiratory complications will improve Outcome: Progressing Goal: Cardiovascular complication will be avoided Outcome: Progressing   Problem: Activity: Goal: Risk for activity intolerance will decrease Outcome: Progressing   Problem: Nutrition: Goal: Adequate nutrition will be maintained Outcome: Progressing   Problem: Coping: Goal: Level of anxiety will decrease Outcome: Progressing   Problem: Elimination: Goal: Will not experience complications related to bowel motility Outcome: Progressing Goal: Will not experience complications related to urinary retention Outcome: Progressing   Problem: Pain Managment: Goal: General experience of comfort will improve Outcome: Progressing   Problem: Safety: Goal: Ability to remain free from injury will improve Outcome: Progressing   Problem: Skin Integrity: Goal: Risk for impaired skin integrity will decrease Outcome: Progressing   Problem: Fluid Volume: Goal: Hemodynamic stability will improve Outcome: Progressing   Problem: Clinical Measurements: Goal: Diagnostic test results will improve Outcome: Progressing Goal: Signs and symptoms of infection will decrease Outcome: Progressing   Problem: Respiratory: Goal: Ability to maintain adequate ventilation will improve Outcome: Progressing   

## 2023-03-27 NOTE — Plan of Care (Signed)

## 2023-03-27 NOTE — Progress Notes (Signed)
PROGRESS NOTE    Nicholas Moody  CZY:[id number]  DOB: [birth year]  DOA: 03/23/2023 PCP: Anne Ng, NP Outpatient Specialists:   Hospital course:  67 year old man with HTN who was admitted for acute appendicitis with 2X 1 cm periappendiceal abscess. Patient was seen by general surgery who are recommending conservative management with IV antibiotics. Abscess is thought to be too small for drainage.  Patient remains in the hospital.  On IV antibiotics.   Pain and white cell count is improving.  Subjective:  Patient seen and examined.  Patient tells me that his pain is mostly negligent and doing much better.  He is tolerating liquids.  Afebrile.  Denies any nausea or vomiting.   Objective: Vitals:   03/26/23 0920 03/26/23 1954 03/27/23 0623 03/27/23 0907  BP: (!) 183/94 (!) 164/109 (!) 154/82 (!) 147/97  Pulse: 100 91 75 85  Resp: 18 19 18 18   Temp: 98.5 F (36.9 C) 99.2 F (37.3 C) 98.4 F (36.9 C) 98.4 F (36.9 C)  TempSrc:  Oral Oral   SpO2: 97% 98% 100% 100%  Weight:      Height:        Intake/Output Summary (Last 24 hours) at 03/27/2023 1357 Last data filed at 03/27/2023 0935 Gross per 24 hour  Intake 199.39 ml  Output 0 ml  Net 199.39 ml   Filed Weights   03/23/23 1827 03/24/23 0428  Weight: 82.1 kg 79.4 kg     Exam:  General: Looks comfortable. Cardiovascular: S1-S2 normal.  Regular rate rhythm Respiratory: Bilateral clear.  No added sounds Gastrointestinal: Soft.  Mild Tenderness right lower quadrant.  Bowel sounds present. Ext: No edema. Neuro: Alert awake and oriented. Musculoskeletal: No deformities.    Data Reviewed:  Basic Metabolic Panel: Recent Labs  Lab 03/23/23 1835 03/24/23 0646 03/25/23 0644 03/26/23 0902 03/27/23 0431  NA 131* 132* 133* 128* 129*  K 3.3* 3.2* 3.6 3.2* 3.9  CL 95* 96* 95* 91* 95*  CO2 20* 21* 25 25 25   GLUCOSE 115* 115* 126* 111* 102*  BUN 14 14 13 15 15   CREATININE 1.11 1.17 1.07 0.98 1.02   CALCIUM 9.3 8.7* 8.6* 8.3* 8.2*  MG  --  1.7 1.9  --   --   PHOS  --  2.6  --   --   --     CBC: Recent Labs  Lab 03/23/23 1835 03/24/23 0646 03/25/23 0644 03/26/23 0902 03/27/23 0917  WBC 25.2* 26.7* 21.5* 18.6* 14.7*  NEUTROABS  --  22.4* 18.7* 15.3*  --   HGB 14.0 12.9* 15.1 14.0 12.7*  HCT 39.0 35.5* 41.9 38.4* 34.8*  MCV 90.5 90.3 90.9 91.0 89.7  PLT 301 277 309 332 337     Scheduled Meds:  [START ON 03/28/2023] heparin  5,000 Units Subcutaneous Q8H   levothyroxine  125 mcg Oral Q0600   lisinopril  20 mg Oral Daily   pantoprazole  40 mg Oral Daily   Continuous Infusions:  piperacillin-tazobactam Stopped (03/27/23 0913)     Assessment & Plan:   Acute perforated appendicitis with localized abscess On clear liquid diet and advancing as tolerated to vegan diet. Remains on IV Zosyn.  Adequate pain medications.  WBC trending down.  Repeat CT scan with fluid collection. Surgery expecting continue conservative management.   Hypokalemia Replaced.  Metabolic acidosis Resolved with treatment of infection and fluid resuscitation  HTN Blood pressure is better after starting back on home lisinopril.  Labetalol as needed.  Hypothyroidism Continue Synthroid  DVT prophylaxis: Subcu heparin Code Status: Full Family Communication: none      Studies: CT ABDOMEN PELVIS W CONTRAST  Result Date: 03/26/2023 CLINICAL DATA:  Known appendiceal abscess, increasing abdominal pain EXAM: CT ABDOMEN AND PELVIS WITH CONTRAST TECHNIQUE: Multidetector CT imaging of the abdomen and pelvis was performed using the standard protocol following bolus administration of intravenous contrast. RADIATION DOSE REDUCTION: This exam was performed according to the departmental dose-optimization program which includes automated exposure control, adjustment of the mA and/or kV according to patient size and/or use of iterative reconstruction technique. CONTRAST:  75mL OMNIPAQUE IOHEXOL 350 MG/ML  SOLN COMPARISON:  03/23/2023 FINDINGS: Lower chest: No acute pleural or parenchymal lung disease. Hepatobiliary: Stable hepatic cysts. High attenuation material within the gallbladder may reflect vicarious excretion of previously administered contrast versus gallbladder sludge. No evidence of calcified gallstones or acute cholecystitis. No biliary duct dilation. Pancreas: Unremarkable. No pancreatic ductal dilatation or surrounding inflammatory changes. Spleen: Normal in size without focal abnormality. Adrenals/Urinary Tract: Stable nonobstructing right renal calculi. Stable bilateral simple appearing renal cortical cysts which do not require specific imaging follow-up. No evidence of hydronephrosis within either kidney. The adrenals are unremarkable. The bladder is minimally distended, limiting its evaluation. Stomach/Bowel: Continued findings of appendicitis, with a dilated inflamed appendix in the right lower quadrant. The small periappendiceal abscess seen previously is again identified, measuring up to 1 cm reference image 58/3. Increased mesenteric edema within the lower abdomen and pelvis, with interval development of small volume ascites. There has been interval development of a high-grade small bowel obstruction, transition point within the distal jejunum/proximal ileum right lower quadrant, reference axial images 59-64 of series 3. Maximal diameter of the jejunum measures 4.2 cm. Stable diverticulosis of the distal colon without evidence of acute diverticulitis. Vascular/Lymphatic: Aortic atherosclerosis. No enlarged abdominal or pelvic lymph nodes. Reproductive: Prostate is stable. Other: Small volume ascites within the lower abdomen and pelvis as above. No free intraperitoneal gas. No abdominal wall hernia. Musculoskeletal: No acute or destructive bony abnormalities. Reconstructed images demonstrate no additional findings. IMPRESSION: 1. Persistent findings of acute appendicitis, with small  periappendiceal abscess again identified and not appreciably changed in size. 2. Interval development of high-grade small bowel obstruction, transition within the distal jejunum/proximal ileum. 3. Increasing mesenteric edema and small volume ascites within the lower abdomen and pelvis. 4. High density material within the gallbladder consistent with vicarious excretion of contrast versus gallbladder sludge. No evidence of acute cholecystitis. 5. Stable nonobstructing right renal calculi. 6. Stable diverticulosis without diverticulitis. These results will be called to the ordering clinician or representative by the Radiologist Assistant, and communication documented in the PACS or [employer]. Electronically Signed   By: Sharlet Salina M.D.   On: 03/26/2023 19:02    Principal Problem:   Acute appendicitis     Clover Feehan,    LOS: 3 days

## 2023-03-28 DIAGNOSIS — K3589 Other acute appendicitis without perforation or gangrene: Secondary | ICD-10-CM | POA: Diagnosis not present

## 2023-03-28 LAB — COMPREHENSIVE METABOLIC PANEL
ALT: 24 U/L (ref 0–44)
AST: 30 U/L (ref 15–41)
Albumin: 2.8 g/dL — ABNORMAL LOW (ref 3.5–5.0)
Alkaline Phosphatase: 52 U/L (ref 38–126)
Anion gap: 10 (ref 5–15)
BUN: 12 mg/dL (ref 8–23)
CO2: 27 mmol/L (ref 22–32)
Calcium: 8.9 mg/dL (ref 8.9–10.3)
Chloride: 97 mmol/L — ABNORMAL LOW (ref 98–111)
Creatinine, Ser: 1.12 mg/dL (ref 0.61–1.24)
GFR, Estimated: >60 mL/min (ref >60)
Glucose, Bld: 103 mg/dL — ABNORMAL HIGH (ref 70–99)
Potassium: 4.1 mmol/L (ref 3.5–5.1)
Sodium: 134 mmol/L — ABNORMAL LOW (ref 135–145)
Total Bilirubin: 0.8 mg/dL (ref 0.3–1.2)
Total Protein: 6 g/dL — ABNORMAL LOW (ref 6.5–8.1)

## 2023-03-28 LAB — CBC
HCT: 36.3 % — ABNORMAL LOW (ref 39.0–52.0)
Hemoglobin: 13 g/dL (ref 13.0–17.0)
MCH: 33.1 pg (ref 26.0–34.0)
MCHC: 35.8 g/dL (ref 30.0–36.0)
MCV: 92.4 fL (ref 80.0–100.0)
Platelets: 348 10*3/uL (ref 150–400)
RBC: 3.93 MIL/uL — ABNORMAL LOW (ref 4.22–5.81)
RDW: 11.6 % (ref 11.5–15.5)
WBC: 14 10*3/uL — ABNORMAL HIGH (ref 4.0–10.5)
nRBC: 0 % (ref 0.0–0.2)

## 2023-03-28 LAB — PHOSPHORUS: Phosphorus: 2.6 mg/dL (ref 2.5–4.6)

## 2023-03-28 LAB — MAGNESIUM: Magnesium: 2 mg/dL (ref 1.7–2.4)

## 2023-03-28 NOTE — Progress Notes (Signed)
   PROGRESS NOTE    Nicholas Moody  ZOX:[id number]  DOB: 03/17/1956  DOA: 03/23/2023 PCP: Anne Ng, NP Outpatient Specialists:   Hospital course: 67 year old man with HTN who was admitted for acute appendicitis with 2X 1 cm periappendiceal abscess. Patient was seen by general surgery who are recommending conservative management with IV antibiotics. Abscess is thought to be too small for drainage.  Patient remains in the hospital.  On IV antibiotics. Pain and white cell count is improving.  Subjective: Patient denies any new complaints    Objective: Vitals:   03/27/23 1941 03/28/23 0632 03/28/23 0903 03/28/23 1557  BP: (!) 151/95 (!) 150/102 (!) 145/97 (!) 144/96  Pulse: 72 83 73 88  Resp: 18 19  20   Temp: 99.1 F (37.3 C) 98.7 F (37.1 C) 98.5 F (36.9 C) 98.6 F (37 C)  TempSrc: Oral Oral  Oral  SpO2: 99% 98% 99% 98%  Weight:      Height:        Intake/Output Summary (Last 24 hours) at 03/28/2023 1932 Last data filed at 03/28/2023 1233 Gross per 24 hour  Intake 480 ml  Output 0 ml  Net 480 ml   Filed Weights   03/23/23 1827 03/24/23 0428  Weight: 82.1 kg 79.4 kg     Exam: General: NAD  Cardiovascular: S1, S2 present Respiratory: CTAB Abdomen: Soft, nontender, nondistended, bowel sounds present Musculoskeletal: No bilateral pedal edema noted Skin: Normal Psychiatry: Normal mood      Data Reviewed:  Basic Metabolic Panel: Recent Labs  Lab 03/24/23 0646 03/25/23 0644 03/26/23 0902 03/27/23 0431 03/28/23 0431  NA 132* 133* 128* 129* 134*  K 3.2* 3.6 3.2* 3.9 4.1  CL 96* 95* 91* 95* 97*  CO2 21* 25 25 25 27   GLUCOSE 115* 126* 111* 102* 103*  BUN 14 13 15 15 12   CREATININE 1.17 1.07 0.98 1.02 1.12  CALCIUM 8.7* 8.6* 8.3* 8.2* 8.9  MG 1.7 1.9  --   --  2.0  PHOS 2.6  --   --   --  2.6    CBC: Recent Labs  Lab 03/24/23 0646 03/25/23 0644 03/26/23 0902 03/27/23 0917 03/28/23 0431  WBC 26.7* 21.5* 18.6* 14.7* 14.0*  NEUTROABS  22.4* 18.7* 15.3*  --   --   HGB 12.9* 15.1 14.0 12.7* 13.0  HCT 35.5* 41.9 38.4* 34.8* 36.3*  MCV 90.3 90.9 91.0 89.7 92.4  PLT 277 309 332 337 348     Scheduled Meds:  heparin  5,000 Units Subcutaneous Q8H   levothyroxine  125 mcg Oral Q0600   lisinopril  20 mg Oral Daily   pantoprazole  40 mg Oral Daily   Continuous Infusions:  piperacillin-tazobactam 3.375 g (03/28/23 1406)     Assessment & Plan:   Acute perforated appendicitis with localized abscess Currently afebrile with leukocytosis Continue vegan diet Continue IV Zosyn Repeat CT scan with fluid collection Gen surg on board, continue conservative management Daily CBC  Hypokalemia Replaced prn  HTN Continue lisinopril.  Labetalol as needed  Hypothyroidism Continue Synthroid    DVT prophylaxis: Subcu heparin Code Status: Full Family Communication: none      Studies: No results found.  Principal Problem:   Acute appendicitis     Briant Cedar,    LOS: 4 days

## 2023-03-28 NOTE — Progress Notes (Signed)
Progress Note     Subjective: Pt reports abdominal pain is overall improving. Tolerating his diet with no increase in pain.  + flatus.    Objective: Vital signs in last 24 hours: Temp:  [98.5 F (36.9 C)-99.1 F (37.3 C)] 98.5 F (36.9 C) (10/09 0903) Pulse Rate:  [72-95] 73 (10/09 0903) Resp:  [18-19] 19 (10/09 0632) BP: (145-165)/(95-114) 145/97 (10/09 0903) SpO2:  [97 %-99 %] 99 % (10/09 0903) Last BM Date : 03/27/23  Intake/Output from previous day: 10/08 0701 - 10/09 0700 In: 919.4 [P.O.:720; IV Piggyback:199.4] Out: 0  Intake/Output this shift: Total I/O In: 240 [P.O.:240] Out: 0   PE: General: pleasant, WD, thin male NAD Heart: regular, rate, and rhythm.  Lungs: CTAB, no wheezes, rhonchi, or rales noted.  Respiratory effort nonlabored Abd: soft, mild ttp in RLQ without peritonitis or guarding, ND Psych: A&Ox3 with an appropriate affect.    Lab Results:  Recent Labs    03/27/23 0917 03/28/23 0431  WBC 14.7* 14.0*  HGB 12.7* 13.0  HCT 34.8* 36.3*  PLT 337 348   BMET Recent Labs    03/27/23 0431 03/28/23 0431  NA 129* 134*  K 3.9 4.1  CL 95* 97*  CO2 25 27  GLUCOSE 102* 103*  BUN 15 12  CREATININE 1.02 1.12  CALCIUM 8.2* 8.9   PT/INR No results for input(s): "LABPROT", "INR" in the last 72 hours. CMP     Component Value Date/Time   NA 134 (L) 03/28/2023 0431   K 4.1 03/28/2023 0431   CL 97 (L) 03/28/2023 0431   CO2 27 03/28/2023 0431   GLUCOSE 103 (H) 03/28/2023 0431   BUN 12 03/28/2023 0431   CREATININE 1.12 03/28/2023 0431   CALCIUM 8.9 03/28/2023 0431   PROT 6.0 (L) 03/28/2023 0431   ALBUMIN 2.8 (L) 03/28/2023 0431   AST 30 03/28/2023 0431   ALT 24 03/28/2023 0431   ALKPHOS 52 03/28/2023 0431   BILITOT 0.8 03/28/2023 0431   GFRNONAA >60 03/28/2023 0431   Lipase     Component Value Date/Time   LIPASE 20 03/23/2023 1835       Studies/Results: CT ABDOMEN PELVIS W CONTRAST  Result Date: 03/26/2023 CLINICAL DATA:  Known  appendiceal abscess, increasing abdominal pain EXAM: CT ABDOMEN AND PELVIS WITH CONTRAST TECHNIQUE: Multidetector CT imaging of the abdomen and pelvis was performed using the standard protocol following bolus administration of intravenous contrast. RADIATION DOSE REDUCTION: This exam was performed according to the departmental dose-optimization program which includes automated exposure control, adjustment of the mA and/or kV according to patient size and/or use of iterative reconstruction technique. CONTRAST:  75mL OMNIPAQUE IOHEXOL 350 MG/ML SOLN COMPARISON:  03/23/2023 FINDINGS: Lower chest: No acute pleural or parenchymal lung disease. Hepatobiliary: Stable hepatic cysts. High attenuation material within the gallbladder may reflect vicarious excretion of previously administered contrast versus gallbladder sludge. No evidence of calcified gallstones or acute cholecystitis. No biliary duct dilation. Pancreas: Unremarkable. No pancreatic ductal dilatation or surrounding inflammatory changes. Spleen: Normal in size without focal abnormality. Adrenals/Urinary Tract: Stable nonobstructing right renal calculi. Stable bilateral simple appearing renal cortical cysts which do not require specific imaging follow-up. No evidence of hydronephrosis within either kidney. The adrenals are unremarkable. The bladder is minimally distended, limiting its evaluation. Stomach/Bowel: Continued findings of appendicitis, with a dilated inflamed appendix in the right lower quadrant. The small periappendiceal abscess seen previously is again identified, measuring up to 1 cm reference image 58/3. Increased mesenteric edema within the lower  abdomen and pelvis, with interval development of small volume ascites. There has been interval development of a high-grade small bowel obstruction, transition point within the distal jejunum/proximal ileum right lower quadrant, reference axial images 59-64 of series 3. Maximal diameter of the jejunum  measures 4.2 cm. Stable diverticulosis of the distal colon without evidence of acute diverticulitis. Vascular/Lymphatic: Aortic atherosclerosis. No enlarged abdominal or pelvic lymph nodes. Reproductive: Prostate is stable. Other: Small volume ascites within the lower abdomen and pelvis as above. No free intraperitoneal gas. No abdominal wall hernia. Musculoskeletal: No acute or destructive bony abnormalities. Reconstructed images demonstrate no additional findings. IMPRESSION: 1. Persistent findings of acute appendicitis, with small periappendiceal abscess again identified and not appreciably changed in size. 2. Interval development of high-grade small bowel obstruction, transition within the distal jejunum/proximal ileum. 3. Increasing mesenteric edema and small volume ascites within the lower abdomen and pelvis. 4. High density material within the gallbladder consistent with vicarious excretion of contrast versus gallbladder sludge. No evidence of acute cholecystitis. 5. Stable nonobstructing right renal calculi. 6. Stable diverticulosis without diverticulitis. These results will be called to the ordering clinician or representative by the Radiologist Assistant, and communication documented in the PACS or [employer]. Electronically Signed   By: Sharlet Salina M.D.   On: 03/26/2023 19:02    Anti-infectives: Anti-infectives (From admission, onward)    Start     Dose/Rate Route Frequency Ordered Stop   03/24/23 0645  piperacillin-tazobactam (ZOSYN) IVPB 3.375 g        3.375 g 12.5 mL/hr over 240 Minutes Intravenous Every 8 hours 03/24/23 0547     03/23/23 2245  piperacillin-tazobactam (ZOSYN) IVPB 3.375 g        3.375 g 100 mL/hr over 30 Minutes Intravenous  Once 03/23/23 2235 03/23/23 2323        Assessment/Plan Perforated appendicitis with abscess - CT 10/7 with small periappendiceal abscess not significantly changed, possible SBO - discussed with IR and abscess too small for drainage -  WBC improving and VSS - pt clinically feeling better today and having some bowel function  - cont vegan diet as tolerated and continue abx - AM labs, would like to see WBC normalize hopefully prior to transition to oral abx and home.  FEN: vegan diet VTE: SQH ID: [id number] 10/4>>  LOS: 4 days   I reviewed hospitalist notes, last 24 h vitals and pain scores, last 48 h intake and output, last 24 h labs and trends, last 24 h imaging results, and discussed with IR .    Letha Cape, [facility] 03/28/2023, 9:16 AM Please see Amion for pager number during day hours 7:00am-4:30pm

## 2023-03-28 NOTE — Plan of Care (Signed)
  Problem: Health Behavior/Discharge Planning: Goal: Ability to manage health-related needs will improve Outcome: Progressing   

## 2023-03-29 ENCOUNTER — Inpatient Hospital Stay (HOSPITAL_COMMUNITY): Payer: Medicare Other

## 2023-03-29 DIAGNOSIS — K3589 Other acute appendicitis without perforation or gangrene: Secondary | ICD-10-CM | POA: Diagnosis not present

## 2023-03-29 LAB — CBC
HCT: 36.7 % — ABNORMAL LOW (ref 39.0–52.0)
Hemoglobin: 13.2 g/dL (ref 13.0–17.0)
MCH: 33.2 pg (ref 26.0–34.0)
MCHC: 36 g/dL (ref 30.0–36.0)
MCV: 92.2 fL (ref 80.0–100.0)
Platelets: 382 10*3/uL (ref 150–400)
RBC: 3.98 MIL/uL — ABNORMAL LOW (ref 4.22–5.81)
RDW: 11.6 % (ref 11.5–15.5)
WBC: 14.3 10*3/uL — ABNORMAL HIGH (ref 4.0–10.5)
nRBC: 0 % (ref 0.0–0.2)

## 2023-03-29 LAB — CULTURE, BLOOD (ROUTINE X 2)
Culture: NO GROWTH
Culture: NO GROWTH
Special Requests: ADEQUATE
Special Requests: ADEQUATE

## 2023-03-29 MED ORDER — DIATRIZOATE MEGLUMINE & SODIUM 66-10 % PO SOLN
90.0000 mL | Freq: Once | ORAL | Status: AC
  Administered 2023-03-29: 90 mL via NASOGASTRIC
  Filled 2023-03-29: qty 90

## 2023-03-29 MED ORDER — LORAZEPAM 2 MG/ML IJ SOLN
0.5000 mg | Freq: Once | INTRAMUSCULAR | Status: AC
  Administered 2023-03-29: 0.5 mg via INTRAVENOUS
  Filled 2023-03-29: qty 1

## 2023-03-29 NOTE — Progress Notes (Signed)
Progress Note     Subjective: Doesn't really feel well today.  Pain around a 6, no better.  Feels bloated.  No nausea, but not hungry and didn't eat much yesterday at all.  Minimal flatus, but no BM since admission.  Objective: Vital signs in last 24 hours: Temp:  [98.1 F (36.7 C)-98.8 F (37.1 C)] 98.3 F (36.8 C) (10/10 0830) Pulse Rate:  [76-92] 87 (10/10 0830) Resp:  [16-20] 20 (10/10 0830) BP: (141-152)/(90-96) 141/92 (10/10 0830) SpO2:  [98 %-100 %] 99 % (10/10 0830) Last BM Date : 03/27/23  Intake/Output from previous day: 10/09 0701 - 10/10 0700 In: 984.6 [P.O.:720; IV Piggyback:264.6] Out: 0  Intake/Output this shift: Total I/O In: 220 [P.O.:220] Out: -   PE: General: pleasant, WD, thin male NAD Heart: regular, rate, and rhythm.  Lungs: CTAB, no wheezes, rhonchi, or rales noted.  Respiratory effort nonlabored Abd: soft, still tender in RLQ, more distended today, decrease BS Psych: A&Ox3 with an appropriate affect.    Lab Results:  Recent Labs    03/28/23 0431 03/29/23 0512  WBC 14.0* 14.3*  HGB 13.0 13.2  HCT 36.3* 36.7*  PLT 348 382   BMET Recent Labs    03/27/23 0431 03/28/23 0431  NA 129* 134*  K 3.9 4.1  CL 95* 97*  CO2 25 27  GLUCOSE 102* 103*  BUN 15 12  CREATININE 1.02 1.12  CALCIUM 8.2* 8.9   PT/INR No results for input(s): "LABPROT", "INR" in the last 72 hours. CMP     Component Value Date/Time   NA 134 (L) 03/28/2023 0431   K 4.1 03/28/2023 0431   CL 97 (L) 03/28/2023 0431   CO2 27 03/28/2023 0431   GLUCOSE 103 (H) 03/28/2023 0431   BUN 12 03/28/2023 0431   CREATININE 1.12 03/28/2023 0431   CALCIUM 8.9 03/28/2023 0431   PROT 6.0 (L) 03/28/2023 0431   ALBUMIN 2.8 (L) 03/28/2023 0431   AST 30 03/28/2023 0431   ALT 24 03/28/2023 0431   ALKPHOS 52 03/28/2023 0431   BILITOT 0.8 03/28/2023 0431   GFRNONAA >60 03/28/2023 0431   Lipase     Component Value Date/Time   LIPASE 20 03/23/2023 1835        Studies/Results: No results found.  Anti-infectives: Anti-infectives (From admission, onward)    Start     Dose/Rate Route Frequency Ordered Stop   03/24/23 0645  piperacillin-tazobactam (ZOSYN) IVPB 3.375 g        3.375 g 12.5 mL/hr over 240 Minutes Intravenous Every 8 hours 03/24/23 0547     03/23/23 2245  piperacillin-tazobactam (ZOSYN) IVPB 3.375 g        3.375 g 100 mL/hr over 30 Minutes Intravenous  Once 03/23/23 2235 03/23/23 2323        Assessment/Plan Perforated appendicitis with abscess and ileus vs SBO - CT 10/7 with small periappendiceal abscess not significantly changed, possible SBO/ileus - discussed with IR and abscess too small for drainage - WBC stable at 14K and VSS - pt does not feel as well today and is more distended.  Will repeat plain film today.  Discussed with patient may need NGT for decompression.  If this is the case, then we may need to go ahead and consider PICC/TNA -at some point moving forward may need repeat CT as well to reassess fluid collection.  Continue conservative management for now in hopes of improvement without the need for OR. -discussed with primary service on the ward.  FEN: vegan  diet, but may need to back down or place NGT VTE: SQH ID: [email or web address] 10/4>>  LOS: 5 days   I reviewed hospitalist notes, last 24 h vitals and pain scores, last 48 h intake and output, last 24 h labs and trends, last 24 h imaging results, and discussed with IR .    Letha Cape, [facility] 03/29/2023, 10:04 AM Please see Amion for pager number during day hours 7:00am-4:30pm

## 2023-03-29 NOTE — Progress Notes (Addendum)
NG tube verified placement at 15:55, set up to LIS for 2 hours per PA. At approx 18:07 suction turned off of NG tube,  gastrografin given through NG tube and clamped. This nurse called x-ray spoke with nicole that med was given at 18:07 and films can be taken 8 hours from then. Will update note when I unclamp the NG tube and place back to suction.   At aaprox 19:05 placed pt back to LIS.

## 2023-03-29 NOTE — Progress Notes (Addendum)
PROGRESS NOTE    Nicholas Moody  GNF:[id number]  DOB: 06/22/1955  DOA: 03/23/2023 PCP: Anne Ng, NP Outpatient Specialists:   Hospital course: 67 year old man with HTN who was admitted for acute appendicitis with 2X 1 cm periappendiceal abscess. Patient was seen by general surgery who are recommending conservative management with IV antibiotics. Abscess is thought to be too small for drainage. Plan for conservative management.    Subjective: Patient reports one small BM since admission, reports passing gas. Reports small improvement overall. Concerned about SBO seen on recent CT abd/pelvis, discussed with general surgery team today   Objective: Vitals:   03/28/23 1557 03/28/23 2038 03/29/23 0528 03/29/23 0830  BP: (!) 144/96 (!) 152/91 (!) 148/90 (!) 141/92  Pulse: 88 92 76 87  Resp: 20  16 20   Temp: 98.6 F (37 C) 98.8 F (37.1 C) 98.1 F (36.7 C) 98.3 F (36.8 C)  TempSrc: Oral Oral Oral Oral  SpO2: 98% 100% 98% 99%  Weight:      Height:        Intake/Output Summary (Last 24 hours) at 03/29/2023 1307 Last data filed at 03/29/2023 1219 Gross per 24 hour  Intake 759.99 ml  Output 0 ml  Net 759.99 ml   Filed Weights   03/23/23 1827 03/24/23 0428  Weight: 82.1 kg 79.4 kg     Exam: General: NAD  Cardiovascular: S1, S2 present Respiratory: CTAB Abdomen: Soft, RLL tender, nondistended, bowel sounds present Musculoskeletal: No bilateral pedal edema noted Skin: Normal Psychiatry: Normal mood      Data Reviewed:  Basic Metabolic Panel: Recent Labs  Lab 03/24/23 0646 03/25/23 0644 03/26/23 0902 03/27/23 0431 03/28/23 0431  NA 132* 133* 128* 129* 134*  K 3.2* 3.6 3.2* 3.9 4.1  CL 96* 95* 91* 95* 97*  CO2 21* 25 25 25 27   GLUCOSE 115* 126* 111* 102* 103*  BUN 14 13 15 15 12   CREATININE 1.17 1.07 0.98 1.02 1.12  CALCIUM 8.7* 8.6* 8.3* 8.2* 8.9  MG 1.7 1.9  --   --  2.0  PHOS 2.6  --   --   --  2.6    CBC: Recent Labs  Lab  03/24/23 0646 03/25/23 0644 03/26/23 0902 03/27/23 0917 03/28/23 0431 03/29/23 0512  WBC 26.7* 21.5* 18.6* 14.7* 14.0* 14.3*  NEUTROABS 22.4* 18.7* 15.3*  --   --   --   HGB 12.9* 15.1 14.0 12.7* 13.0 13.2  HCT 35.5* 41.9 38.4* 34.8* 36.3* 36.7*  MCV 90.3 90.9 91.0 89.7 92.4 92.2  PLT 277 309 332 337 348 382     Scheduled Meds:  diatrizoate meglumine-sodium  90 mL Per NG tube Once   heparin  5,000 Units Subcutaneous Q8H   levothyroxine  125 mcg Oral Q0600   lisinopril  20 mg Oral Daily   pantoprazole  40 mg Oral Daily   Continuous Infusions:  piperacillin-tazobactam Stopped (03/29/23 0949)     Assessment & Plan:   Acute perforated appendicitis with localized abscess Possible ileus Vs SBO Currently afebrile with leukocytosis Continue IV Zosyn Repeat CT scan with fluid collection, possible SBO/ileus Gen surg on board, plan for repeat plain film, may need NGT if SBO/ileus is present, continue conservative management Daily CBC  Hypokalemia Replaced prn  HTN Continue lisinopril.  Labetalol as needed  Hypothyroidism Continue Synthroid    DVT prophylaxis: Subcu heparin Code Status: Full Family Communication: none      Studies: DG Abd Portable 1V  Result Date: 03/29/2023 CLINICAL DATA:  Ileus. EXAM: PORTABLE ABDOMEN - 1 VIEW COMPARISON:  March 12, 2013. FINDINGS: Dilated small bowel loops are noted concerning for ileus or distal small bowel obstruction. Contrast is noted in nondilated colon. Phleboliths are noted in the pelvis. IMPRESSION: Small bowel dilatation is noted concerning for ileus or distal small bowel obstruction. Electronically Signed   By: Lupita Raider M.D.   On: 03/29/2023 12:25    Principal Problem:   Acute appendicitis     Monica Martinez Chrishonda Hesch,    LOS: 5 days

## 2023-03-30 ENCOUNTER — Encounter (HOSPITAL_COMMUNITY)
Admission: EM | Disposition: A | Discharge status: Home / Self Care | Payer: Self-pay | Source: Home / Self Care | Attending: Internal Medicine

## 2023-03-30 ENCOUNTER — Inpatient Hospital Stay (HOSPITAL_COMMUNITY): Payer: Medicare Other

## 2023-03-30 ENCOUNTER — Other Ambulatory Visit: Payer: Self-pay

## 2023-03-30 ENCOUNTER — Encounter (HOSPITAL_COMMUNITY): Payer: Self-pay | Admitting: Internal Medicine

## 2023-03-30 DIAGNOSIS — K35211 Acute appendicitis with generalized peritonitis, with perforation and abscess: Secondary | ICD-10-CM | POA: Diagnosis not present

## 2023-03-30 DIAGNOSIS — K3589 Other acute appendicitis without perforation or gangrene: Secondary | ICD-10-CM | POA: Diagnosis not present

## 2023-03-30 HISTORY — PX: LAPAROSCOPY: SHX197

## 2023-03-30 HISTORY — PX: LAPAROSCOPIC APPENDECTOMY: SHX408

## 2023-03-30 LAB — BASIC METABOLIC PANEL
Anion gap: 18 — ABNORMAL HIGH (ref 5–15)
BUN: 21 mg/dL (ref 8–23)
CO2: 25 mmol/L (ref 22–32)
Calcium: 9.3 mg/dL (ref 8.9–10.3)
Chloride: 91 mmol/L — ABNORMAL LOW (ref 98–111)
Creatinine, Ser: 1.34 mg/dL — ABNORMAL HIGH (ref 0.61–1.24)
GFR, Estimated: 58 mL/min — ABNORMAL LOW (ref >60)
Glucose, Bld: 104 mg/dL — ABNORMAL HIGH (ref 70–99)
Potassium: 3.1 mmol/L — ABNORMAL LOW (ref 3.5–5.1)
Sodium: 134 mmol/L — ABNORMAL LOW (ref 135–145)

## 2023-03-30 LAB — CBC WITH DIFFERENTIAL/PLATELET
Abs Immature Granulocytes: 0.19 10*3/uL — ABNORMAL HIGH (ref 0.00–0.07)
Basophils Absolute: 0.1 10*3/uL (ref 0.0–0.1)
Basophils Relative: 0 %
Eosinophils Absolute: 0.1 10*3/uL (ref 0.0–0.5)
Eosinophils Relative: 1 %
HCT: 41.1 % (ref 39.0–52.0)
Hemoglobin: 14.5 g/dL (ref 13.0–17.0)
Immature Granulocytes: 1 %
Lymphocytes Relative: 14 %
Lymphs Abs: 2.4 10*3/uL (ref 0.7–4.0)
MCH: 31.6 pg (ref 26.0–34.0)
MCHC: 35.3 g/dL (ref 30.0–36.0)
MCV: 89.5 fL (ref 80.0–100.0)
Monocytes Absolute: 1.4 10*3/uL — ABNORMAL HIGH (ref 0.1–1.0)
Monocytes Relative: 8 %
Neutro Abs: 13.4 10*3/uL — ABNORMAL HIGH (ref 1.7–7.7)
Neutrophils Relative %: 76 %
Platelets: 476 10*3/uL — ABNORMAL HIGH (ref 150–400)
RBC: 4.59 MIL/uL (ref 4.22–5.81)
RDW: 11.9 % (ref 11.5–15.5)
WBC: 17.5 10*3/uL — ABNORMAL HIGH (ref 4.0–10.5)
nRBC: 0 % (ref 0.0–0.2)

## 2023-03-30 LAB — ABO/RH: ABO/RH(D): O POS

## 2023-03-30 LAB — TYPE AND SCREEN
ABO/RH(D): O POS
Antibody Screen: NEGATIVE

## 2023-03-30 SURGERY — LAPAROSCOPY, DIAGNOSTIC
Anesthesia: General

## 2023-03-30 MED ORDER — CHLORHEXIDINE GLUCONATE 0.12 % MT SOLN: 15.0000 mL | Freq: Once | OROMUCOSAL | Status: AC

## 2023-03-30 MED ORDER — ACETAMINOPHEN 10 MG/ML IV SOLN
INTRAVENOUS | Status: DC | PRN
Start: 2023-03-30 — End: 2023-03-30
  Administered 2023-03-30: 1000 mg via INTRAVENOUS

## 2023-03-30 MED ORDER — MIDAZOLAM HCL 2 MG/2ML IJ SOLN
INTRAMUSCULAR | Status: DC | PRN
  Administered 2023-03-30: 2 mg via INTRAVENOUS

## 2023-03-30 MED ORDER — PANTOPRAZOLE SODIUM 40 MG PO TBEC
40.0000 mg | DELAYED_RELEASE_TABLET | Freq: Every day | ORAL | Status: DC
  Administered 2023-04-02 – 2023-04-03 (×2): 40 mg via ORAL
  Filled 2023-03-30 (×3): qty 1

## 2023-03-30 MED ORDER — ORAL CARE MOUTH RINSE: 15.0000 mL | Freq: Once | OROMUCOSAL | Status: AC

## 2023-03-30 MED ORDER — BUPIVACAINE-EPINEPHRINE 0.25% -1:200000 IJ SOLN
INTRAMUSCULAR | Status: DC | PRN
  Administered 2023-03-30: 16 mL

## 2023-03-30 MED ORDER — PROPOFOL 10 MG/ML IV BOLUS
INTRAVENOUS | Status: AC
  Filled 2023-03-30: qty 20

## 2023-03-30 MED ORDER — POTASSIUM CHLORIDE 10 MEQ/100ML IV SOLN
10.0000 meq | INTRAVENOUS | Status: AC
  Administered 2023-03-30 (×3): 10 meq via INTRAVENOUS
  Filled 2023-03-30 (×3): qty 100

## 2023-03-30 MED ORDER — FENTANYL CITRATE (PF) 250 MCG/5ML IJ SOLN
INTRAMUSCULAR | Status: DC | PRN
  Administered 2023-03-30 (×5): 50 ug via INTRAVENOUS

## 2023-03-30 MED ORDER — PROPOFOL 10 MG/ML IV BOLUS
INTRAVENOUS | Status: DC | PRN
  Administered 2023-03-30: 150 mg via INTRAVENOUS

## 2023-03-30 MED ORDER — FENTANYL CITRATE (PF) 250 MCG/5ML IJ SOLN
INTRAMUSCULAR | Status: AC
  Filled 2023-03-30: qty 5

## 2023-03-30 MED ORDER — CHLORHEXIDINE GLUCONATE 0.12 % MT SOLN
OROMUCOSAL | Status: AC
  Administered 2023-03-30: 15 mL via OROMUCOSAL
  Filled 2023-03-30: qty 15

## 2023-03-30 MED ORDER — DEXAMETHASONE SODIUM PHOSPHATE 10 MG/ML IJ SOLN
INTRAMUSCULAR | Status: DC | PRN
  Administered 2023-03-30: 4 mg via INTRAVENOUS

## 2023-03-30 MED ORDER — LACTATED RINGERS IV SOLN: INTRAVENOUS | Status: AC

## 2023-03-30 MED ORDER — MIDAZOLAM HCL 2 MG/2ML IJ SOLN
INTRAMUSCULAR | Status: AC
  Filled 2023-03-30: qty 2

## 2023-03-30 MED ORDER — ONDANSETRON HCL 4 MG/2ML IJ SOLN
INTRAMUSCULAR | Status: DC | PRN
  Administered 2023-03-30: 4 mg via INTRAVENOUS

## 2023-03-30 MED ORDER — PHENYLEPHRINE 80 MCG/ML (10ML) SYRINGE FOR IV PUSH (FOR BLOOD PRESSURE SUPPORT)
PREFILLED_SYRINGE | INTRAVENOUS | Status: DC | PRN
  Administered 2023-03-30: 160 ug via INTRAVENOUS

## 2023-03-30 MED ORDER — HYDRALAZINE HCL 20 MG/ML IJ SOLN
10.0000 mg | Freq: Three times a day (TID) | INTRAMUSCULAR | Status: DC | PRN
  Filled 2023-03-30: qty 1

## 2023-03-30 MED ORDER — HYDROMORPHONE HCL 1 MG/ML IJ SOLN
1.0000 mg | INTRAMUSCULAR | Status: DC | PRN
  Administered 2023-03-30: 1 mg via INTRAVENOUS
  Filled 2023-03-30: qty 1

## 2023-03-30 MED ORDER — SUCCINYLCHOLINE CHLORIDE 200 MG/10ML IV SOSY
PREFILLED_SYRINGE | INTRAVENOUS | Status: DC | PRN
  Administered 2023-03-30: 100 mg via INTRAVENOUS

## 2023-03-30 MED ORDER — ACETAMINOPHEN 10 MG/ML IV SOLN
INTRAVENOUS | Status: AC
  Filled 2023-03-30: qty 100

## 2023-03-30 MED ORDER — PANTOPRAZOLE SODIUM 40 MG IV SOLR
40.0000 mg | Freq: Every day | INTRAVENOUS | Status: DC
  Administered 2023-03-30 – 2023-04-01 (×3): 40 mg via INTRAVENOUS
  Filled 2023-03-30 (×3): qty 10

## 2023-03-30 MED ORDER — HYDROMORPHONE HCL 1 MG/ML IJ SOLN
INTRAMUSCULAR | Status: AC
  Filled 2023-03-30: qty 1

## 2023-03-30 MED ORDER — HYDROMORPHONE HCL 1 MG/ML IJ SOLN
0.2500 mg | INTRAMUSCULAR | Status: DC | PRN
  Administered 2023-03-30: 0.5 mg via INTRAVENOUS

## 2023-03-30 MED ORDER — DEXMEDETOMIDINE HCL IN NACL 80 MCG/20ML IV SOLN
INTRAVENOUS | Status: DC | PRN
Start: 2023-03-30 — End: 2023-03-30
  Administered 2023-03-30: 8 ug via INTRAVENOUS

## 2023-03-30 MED ORDER — BUPIVACAINE-EPINEPHRINE (PF) 0.25% -1:200000 IJ SOLN
INTRAMUSCULAR | Status: AC
  Filled 2023-03-30: qty 30

## 2023-03-30 MED ORDER — ROCURONIUM BROMIDE 10 MG/ML (PF) SYRINGE
PREFILLED_SYRINGE | INTRAVENOUS | Status: DC | PRN
  Administered 2023-03-30: 70 mg via INTRAVENOUS

## 2023-03-30 MED ORDER — SUGAMMADEX SODIUM 200 MG/2ML IV SOLN
INTRAVENOUS | Status: DC | PRN
  Administered 2023-03-30: 200 mg via INTRAVENOUS

## 2023-03-30 SURGICAL SUPPLY — 67 items
ADH SKN CLS APL DERMABOND .7 (GAUZE/BANDAGES/DRESSINGS) ×2
APL PRP STRL LF DISP 70% ISPRP (MISCELLANEOUS) ×2
BAG COUNTER SPONGE SURGICOUNT (BAG) ×2 IMPLANT
BAG SPEC RTRVL 10 TROC 200 (ENDOMECHANICALS) ×2
BAG SPNG CNTER NS LX DISP (BAG) ×2
BIOPATCH BLUE 3/4IN DISK W/1.5 (GAUZE/BANDAGES/DRESSINGS) IMPLANT
BLADE CLIPPER SURG (BLADE) IMPLANT
CANISTER SUCT 3000ML PPV (MISCELLANEOUS) ×2 IMPLANT
CHLORAPREP W/TINT 26 (MISCELLANEOUS) ×2 IMPLANT
COVER SURGICAL LIGHT HANDLE (MISCELLANEOUS) ×2 IMPLANT
CUTTER FLEX LINEAR 45M (STAPLE) IMPLANT
DERMABOND ADVANCED .7 DNX12 (GAUZE/BANDAGES/DRESSINGS) ×2 IMPLANT
DRAPE WARM FLUID 44X44 (DRAPES) ×2 IMPLANT
DRSG OPSITE POSTOP 4X10 (GAUZE/BANDAGES/DRESSINGS) IMPLANT
DRSG OPSITE POSTOP 4X8 (GAUZE/BANDAGES/DRESSINGS) IMPLANT
DRSG TEGADERM 4X4.75 (GAUZE/BANDAGES/DRESSINGS) IMPLANT
ELECT CAUTERY BLADE 6.4 (BLADE) ×4 IMPLANT
ELECT REM PT RETURN 9FT ADLT (ELECTROSURGICAL) ×2
ELECTRODE REM PT RTRN 9FT ADLT (ELECTROSURGICAL) ×2 IMPLANT
GLOVE BIO SURGEON STRL SZ8 (GLOVE) ×4 IMPLANT
GLOVE BIOGEL PI IND STRL 8 (GLOVE) ×4 IMPLANT
GOWN STRL REUS W/ TWL LRG LVL3 (GOWN DISPOSABLE) ×12 IMPLANT
GOWN STRL REUS W/ TWL XL LVL3 (GOWN DISPOSABLE) ×2 IMPLANT
GOWN STRL REUS W/TWL LRG LVL3 (GOWN DISPOSABLE) ×12
GOWN STRL REUS W/TWL XL LVL3 (GOWN DISPOSABLE) ×2
IRRIG SUCT STRYKERFLOW 2 WTIP (MISCELLANEOUS)
IRRIGATION SUCT STRKRFLW 2 WTP (MISCELLANEOUS) IMPLANT
KIT BASIN OR (CUSTOM PROCEDURE TRAY) ×2 IMPLANT
KIT SIGMOIDOSCOPE (SET/KITS/TRAYS/PACK) IMPLANT
KIT TURNOVER KIT B (KITS) ×4 IMPLANT
LIGASURE IMPACT 36 18CM CVD LR (INSTRUMENTS) IMPLANT
NS IRRIG 1000ML POUR BTL (IV SOLUTION) ×4 IMPLANT
PACK COLON (CUSTOM PROCEDURE TRAY) ×2 IMPLANT
PAD ARMBOARD 7.5X6 YLW CONV (MISCELLANEOUS) ×2 IMPLANT
PENCIL SMOKE EVACUATOR (MISCELLANEOUS) ×2 IMPLANT
POUCH RETRIEVAL ECOSAC 10 (ENDOMECHANICALS) IMPLANT
RELOAD 45 VASCULAR/THIN (ENDOMECHANICALS) ×4 IMPLANT
RELOAD STAPLE 45 2.5 WHT GRN (ENDOMECHANICALS) IMPLANT
RELOAD STAPLE 45 3.5 BLU ETS (ENDOMECHANICALS) IMPLANT
RELOAD STAPLE TA45 3.5 REG BLU (ENDOMECHANICALS) IMPLANT
SCISSORS LAP 5X35 DISP (ENDOMECHANICALS) IMPLANT
SET TUBE SMOKE EVAC HIGH FLOW (TUBING) ×2 IMPLANT
SHEARS HARMONIC ACE PLUS 36CM (ENDOMECHANICALS) IMPLANT
SLEEVE Z-THREAD 5X100MM (TROCAR) ×2 IMPLANT
SPECIMEN JAR X LARGE (MISCELLANEOUS) ×2 IMPLANT
SPIKE FLUID TRANSFER (MISCELLANEOUS) ×2 IMPLANT
SPONGE T-LAP 18X18 ~~LOC~~+RFID (SPONGE) IMPLANT
STAPLER VISISTAT 35W (STAPLE) ×2 IMPLANT
SURGILUBE 2OZ TUBE FLIPTOP (MISCELLANEOUS) IMPLANT
SUT MNCRL AB 4-0 PS2 18 (SUTURE) ×2 IMPLANT
SUT MON AB 3-0 SH 27 (SUTURE)
SUT MON AB 3-0 SH27 (SUTURE) IMPLANT
SUT PDS AB 1 CTX 36 (SUTURE) IMPLANT
SUT PROLENE 2 0 CT2 30 (SUTURE) IMPLANT
SUT PROLENE 2 0 KS (SUTURE) IMPLANT
SUT VIC AB 3-0 SH 18 (SUTURE) ×2 IMPLANT
SUT VICRYL AB 2 0 TIES (SUTURE) ×2 IMPLANT
SUT VICRYL AB 3 0 TIES (SUTURE) ×2 IMPLANT
TOWEL GREEN STERILE (TOWEL DISPOSABLE) ×2 IMPLANT
TOWEL GREEN STERILE FF (TOWEL DISPOSABLE) ×2 IMPLANT
TRAY FOLEY MTR SLVR 14FR STAT (SET/KITS/TRAYS/PACK) IMPLANT
TRAY LAPAROSCOPIC MC (CUSTOM PROCEDURE TRAY) ×2 IMPLANT
TROCAR 11X100 Z THREAD (TROCAR) IMPLANT
TROCAR BALLN 12MMX100 BLUNT (TROCAR) IMPLANT
TROCAR Z-THREAD OPTICAL 5X100M (TROCAR) ×2 IMPLANT
TUBE CONNECTING 12X1/4 (SUCTIONS) ×4 IMPLANT
WARMER LAPAROSCOPE (MISCELLANEOUS) ×2 IMPLANT

## 2023-03-30 NOTE — Anesthesia Procedure Notes (Signed)
Procedure Name: Intubation Date/Time: 03/30/2023 11:27 AM  Performed by: Sandie Ano, CRNAPre-anesthesia Checklist: Patient identified, Emergency Drugs available, Suction available and Patient being monitored Patient Re-evaluated:Patient Re-evaluated prior to induction Oxygen Delivery Method: Circle System Utilized Preoxygenation: Pre-oxygenation with 100% oxygen Induction Type: IV induction Ventilation: Mask ventilation without difficulty Laryngoscope Size: Mac and 3 Grade View: Grade I Tube type: Oral Tube size: 7.0 mm Number of attempts: 1 Airway Equipment and Method: Stylet and Oral airway Placement Confirmation: ETT inserted through vocal cords under direct vision, positive ETCO2 and breath sounds checked- equal and bilateral Secured at: 22 cm Tube secured with: Tape Dental Injury: Teeth and Oropharynx as per pre-operative assessment

## 2023-03-30 NOTE — Progress Notes (Addendum)
-   Per chart review patient has been admitted for perforated appendicitis with abscess then development of ileus versus SBO. - Per note review of general surgery as patient is developing partial SBO plan for laparoscopy today 10/11. -Continue NG tube with intermittent suction. - I have discontinued to diet and keeping patient n.p.o. with allowing oral meds and ice chips.  Tereasa Coop, MD [facility] 03/30/2023, 5:30 AM

## 2023-03-30 NOTE — Anesthesia Preprocedure Evaluation (Signed)
Anesthesia Evaluation  Patient identified by MRN, date of birth, ID band Patient awake    Reviewed: Allergy & Precautions, H&P , NPO status , Patient's Chart, lab work & pertinent test results  Airway Mallampati: II  TM Distance: >3 FB Neck ROM: Full    Dental no notable dental hx. (+) Teeth Intact, Dental Advisory Given   Pulmonary asthma    Pulmonary exam normal breath sounds clear to auscultation       Cardiovascular hypertension, Pt. on medications  Rhythm:Regular Rate:Normal     Neuro/Psych   Anxiety Depression    negative neurological ROS     GI/Hepatic negative GI ROS, Neg liver ROS,,,  Endo/Other  Hypothyroidism    Renal/GU negative Renal ROS  negative genitourinary   Musculoskeletal   Abdominal   Peds  Hematology negative hematology ROS (+)   Anesthesia Other Findings   Reproductive/Obstetrics negative OB ROS                             Anesthesia Physical Anesthesia Plan  ASA: 2  Anesthesia Plan: General   Post-op Pain Management: Ofirmev IV (intra-op)*   Induction: Intravenous, Rapid sequence and Cricoid pressure planned  PONV Risk Score and Plan: 3 and Ondansetron and Dexamethasone  Airway Management Planned: Oral ETT  Additional Equipment:   Intra-op Plan:   Post-operative Plan: Extubation in OR  Informed Consent: I have reviewed the patients History and Physical, chart, labs and discussed the procedure including the risks, benefits and alternatives for the proposed anesthesia with the patient or authorized representative who has indicated his/her understanding and acceptance.     Dental advisory given  Plan Discussed with: CRNA  Anesthesia Plan Comments:        Anesthesia Quick Evaluation

## 2023-03-30 NOTE — TOC Progression Note (Signed)
Transition of Care [facility]) - Progression Note    Patient Details  Name: Nicholas Moody MRN: [id number] Date of Birth: 1956-02-09  [facility] CM/SW Contact  Tom-Johnson, Hershal Coria, RN Phone Number: 03/30/2023, 3:17 PM  Clinical Narrative:     Patient underwent Laparoscopic Appendectomy with drainage of Pelvic Abscess and drain placement. Continues on IV abx, has NG tube to Low Intermittent Suction. Gen Sx following.   Patient not Medically ready for discharge.  CM will continue to follow as patient progresses with care towards discharge.          Expected Discharge Plan and Services                                               Social Determinants of Health (SDOH) Interventions SDOH Screenings   Food Insecurity: No Food Insecurity (03/24/2023)  Housing: Low Risk  (03/24/2023)  Transportation Needs: No Transportation Needs (03/24/2023)  Utilities: Not At Risk (03/24/2023)  Alcohol Screen: Low Risk  (07/13/2021)  Depression (PHQ2-9): Medium Risk (03/08/2023)  Financial Resource Strain: Low Risk  (07/17/2022)  Physical Activity: Inactive (09/05/2022)  Social Connections: Moderately Isolated (07/13/2021)  Stress: No Stress Concern Present (07/17/2022)  Tobacco Use: Low Risk  (03/30/2023)    Readmission Risk Interventions    03/27/2023    8:46 AM  Readmission Risk Prevention Plan  Post Dischage Appt Complete  Medication Screening Complete  Transportation Screening Complete

## 2023-03-30 NOTE — Interval H&P Note (Signed)
History and Physical Interval Note:  03/30/2023 10:28 AM  Nicholas Moody  has presented today for surgery, with the diagnosis of PERFORATED APPENDICITIS.  The various methods of treatment have been discussed with the patient and family. After consideration of risks, benefits and other options for treatment, the patient has consented to  Procedure(s): LAPAROSCOPY DIAGNOSTIC POSSIBLE LAPAROTOMY POSSIBLE APPENDECTOMY OR PARTIAL COLECTOMY (N/A) PARTIAL COLECTOMY (N/A) as a surgical intervention.  The patient's history has been reviewed, patient examined, no change in status, stable for surgery.  I have reviewed the patient's chart and labs.  Questions were answered to the patient's satisfaction.   The procedure has been discussed with the patient.  Alternative therapies have been discussed with the patient.  Operative risks include bleeding,  Infection,  Organ injury,  Nerve injury,  Blood vessel injury,  DVT,  Pulmonary embolism,  Death,  And possible reoperation.  Medical management risks include worsening of present situation.  The success of the procedure is 50 -90 % at treating patients symptoms.  The patient understands and agrees to proceed.   [place] Radu A Quenisha Lovins

## 2023-03-30 NOTE — Anesthesia Postprocedure Evaluation (Signed)
Anesthesia Post Note  Patient: MANAS HICKLING  Procedure(s) Performed: LAPAROSCOPY DIAGNOSTIC APPENDECTOMY LAPAROSCOPIC     Patient location during evaluation: PACU Anesthesia Type: General Level of consciousness: awake and alert Pain management: pain level controlled Vital Signs Assessment: post-procedure vital signs reviewed and stable Respiratory status: spontaneous breathing, nonlabored ventilation and respiratory function stable Cardiovascular status: blood pressure returned to baseline and stable Postop Assessment: no apparent nausea or vomiting Anesthetic complications: no  No notable events documented.  Last Vitals:  Vitals:   03/30/23 1300 03/30/23 1315  BP: (!) 138/91 (!) 146/96  Pulse: (!) 111 (!) 101  Resp: (!) 31 17  Temp:    SpO2: 92% 96%    Last Pain:  Vitals:   03/30/23 1250  TempSrc:   PainSc: 8                  Bernardina Cacho,W. EDMOND

## 2023-03-30 NOTE — Plan of Care (Signed)
  Problem: Health Behavior/Discharge Planning: Goal: Ability to manage health-related needs will improve Outcome: Progressing   Problem: Clinical Measurements: Goal: Ability to maintain clinical measurements within normal limits will improve Outcome: Progressing Goal: Will remain free from infection Outcome: Progressing   Problem: Activity: Goal: Risk for activity intolerance will decrease Outcome: Progressing   Problem: Nutrition: Goal: Adequate nutrition will be maintained Outcome: Progressing   Problem: Coping: Goal: Level of anxiety will decrease Outcome: Progressing   Problem: Elimination: Goal: Will not experience complications related to bowel motility Outcome: Progressing Goal: Will not experience complications related to urinary retention Outcome: Progressing   Problem: Pain Managment: Goal: General experience of comfort will improve Outcome: Progressing   Problem: Safety: Goal: Ability to remain free from injury will improve Outcome: Progressing   Problem: Skin Integrity: Goal: Risk for impaired skin integrity will decrease Outcome: Progressing   Problem: Fluid Volume: Goal: Hemodynamic stability will improve Outcome: Progressing   Problem: Clinical Measurements: Goal: Diagnostic test results will improve Outcome: Progressing Goal: Signs and symptoms of infection will decrease Outcome: Progressing   Problem: Respiratory: Goal: Ability to maintain adequate ventilation will improve Outcome: Progressing

## 2023-03-30 NOTE — H&P (View-Only) (Signed)
Subjective/Chief Complaint: Patient with more distention and now has an NG tube.  Plain films show small bowel obstruction.  Discussed surgical intervention at this point since his condition is not improving with medical management.   Objective: Vital signs in last 24 hours: Temp:  [98.1 F (36.7 C)-98.5 F (36.9 C)] 98.5 F (36.9 C) (10/11 0600) Pulse Rate:  [98-100] 100 (10/11 0600) Resp:  [20] 20 (10/10 1644) BP: (136-158)/(99-102) 136/99 (10/11 0600) SpO2:  [97 %-99 %] 97 % (10/11 0600) Last BM Date : 03/27/23  Intake/Output from previous day: 10/10 0701 - 10/11 0700 In: 355.3 [P.O.:220; IV Piggyback:135.3] Out: 1625 [Emesis/NG output:1625] Intake/Output this shift: No intake/output data recorded.  Abdomen: Distended tender further quadrant no evidence of diffuse peritonitis.  NG tube in place  Lab Results:  Recent Labs    03/29/23 0512 03/30/23 0502  WBC 14.3* 17.5*  HGB 13.2 14.5  HCT 36.7* 41.1  PLT 382 476*   BMET Recent Labs    03/28/23 0431 03/30/23 0502  NA 134* 134*  K 4.1 3.1*  CL 97* 91*  CO2 27 25  GLUCOSE 103* 104*  BUN 12 21  CREATININE 1.12 1.34*  CALCIUM 8.9 9.3   PT/INR No results for input(s): "LABPROT", "INR" in the last 72 hours. ABG No results for input(s): "PHART", "HCO3" in the last 72 hours.  Invalid input(s): "PCO2", "PO2"  Studies/Results: DG Abd Portable 1V-Small Bowel Protocol-Position Verification  Result Date: 03/29/2023 CLINICAL DATA:  Nasogastric tube placement. EXAM: PORTABLE ABDOMEN - 1 VIEW COMPARISON:  Abdominal radiograph earlier today. FINDINGS: Tip and side port of the enteric tube below the diaphragm in the stomach. Similar gaseous bowel distension centrally. IMPRESSION: Tip and side port of the enteric tube below the diaphragm in the stomach. Electronically Signed   By: Narda Rutherford M.D.   On: 03/29/2023 17:27   DG Abd Portable 1V  Result Date: 03/29/2023 CLINICAL DATA:  Ileus. EXAM: PORTABLE  ABDOMEN - 1 VIEW COMPARISON:  March 12, 2013. FINDINGS: Dilated small bowel loops are noted concerning for ileus or distal small bowel obstruction. Contrast is noted in nondilated colon. Phleboliths are noted in the pelvis. IMPRESSION: Small bowel dilatation is noted concerning for ileus or distal small bowel obstruction. Electronically Signed   By: Lupita Raider M.D.   On: 03/29/2023 12:25    Anti-infectives: Anti-infectives (From admission, onward)    Start     Dose/Rate Route Frequency Ordered Stop   03/24/23 0645  piperacillin-tazobactam (ZOSYN) IVPB 3.375 g        3.375 g 12.5 mL/hr over 240 Minutes Intravenous Every 8 hours 03/24/23 0547     03/23/23 2245  piperacillin-tazobactam (ZOSYN) IVPB 3.375 g        3.375 g 100 mL/hr over 30 Minutes Intravenous  Once 03/23/23 2235 03/23/23 2323       Assessment/Plan: Perforated appendicitis with abscess and ileus vs SBO    Patient has failed medical management.  He now is a bowel obstruction and requires surgical intervention.  I recommend laparoscopy with possible laparotomy, appendectomy, partial colectomy.  He understands that this may need to be done depending on intraoperative findings.  Risks and benefits of surgery reviewed as well as continuing medical management.  There is no role for IR drainage at this point to his bowel obstruction.The procedure has been discussed with the patient.  Alternative therapies have been discussed with the patient.  Operative risks include bleeding,  Infection,  Organ injury,  Nerve injury,  Blood vessel injury, abscess, anastomotic breakdown, hernia, wound complications, DVT,  Pulmonary embolism,  Death,  And possible reoperation.  Medical management risks include worsening of present situation.  The success of the procedure is 50 -90 % at treating patients symptoms.  The patient understands and agrees to proceed.   LOS: 6 days    Dortha Schwalbe 03/30/2023 High complexity

## 2023-03-30 NOTE — Op Note (Signed)
Preoperative diagnosis: Perforated appendicitis with pelvic abscess of small bowel obstruction  Postoperative diagnosis: Same  Procedure: Laparoscopic appendectomy with drainage of pelvic abscess  Surgeon: Harriette Bouillon, MD  Anesthesia: General With 0.25% Marcaine with epinephrine  Drains: 19 round pelvis  Specimen: Appendix/remnant stump of appendix to pathology  EBL: 100 cc  Indications for procedure: The patient is a pleasant 67 year old male was admitted last weekend for perforated pinna status.  He initially improved but over the last 48 hours became more obstructed.  Also his white count crept up.  There is not a drainable fluid collection and therefore recommended laparoscopy with drainage of the abscess and appendectomy of apical.The procedure has been discussed with the patient.  Alternative therapies have been discussed with the patient.  Operative risks include bleeding,  Infection,  Organ injury,  Nerve injury,  Blood vessel injury, open surgery, bowel resection, DVT,  Pulmonary embolism,  Death,  And possible reoperation.  Medical management risks include worsening of present situation.  The success of the procedure is 50 -90 % at treating patients symptoms.  The patient understands and agrees to proceed.    Description of procedure: The patient was met in the holding area questions were answered.  He was taken back to the operating room placed upon upon the operative table.  After induction of general anesthesia, Foley catheter was placed under sterile conditions.  He already had an NG tube in place.  The abdomen was prepped and draped in sterile fashion and timeout performed.  He was already on preoperative antibiotics.  A 1 cm supraumbilical incision was made.  Dissection was carried down the fascia and was identified.  Incision made in the fascia and the fascial edges were delivered up into the wound.  Tresa Endo was used to open up the preperitoneal space into the pleural cavity.   Pursestring suture of 0 Vicryl was placed.  A 12 mm Hassan port was placed under direct vision.  Pneumoperitoneum was created to 15 mmHg of CO2 pressure.  The patient was placed in Trendelenburg and rolled to his left.  There was moderate small bowel dilation.  We placed 3 additional 5 mm ports 1 in the upper midline, second in the left upper quadrant and the third left lower quadrant.  He had a large pelvic abscess with small bowel entangled in this at this point of obstruction.  We were able to bluntly lyse the adhesions between the omentum and small bowel and pelvic abscess.  The small bowel came off readily without injury.  The abscess was noted in the right inguinal canal.  The appendix that extended down and perforated.  We were able to drain this laparoscopically.  The tip the appendix and obliterated from the perforation but the base was intact.  I was able to place a GI a 45 stapler with a white load across the base of the appendiceal stump and fired.  The appendix was extracted with the equal patch of the umbilical port site.  We then irrigated out the pelvis.  Small bowel was examined and run in any adhesions were lysed bluntly freeing up the small bowel.  The point of obstruction was in the pelvic abscess and that was relieved.  The remainder of the examination was otherwise normal.  Through the left lower quadrant port site a 19 round drain was pulled out and placed in the right hemipelvis.  Hemostasis achieved.  Ports were then removed closing the umbilical port site with the pursestring suture already in  place.  Skin closed with 4 Monocryl.  Dermabond applied.  Drain placed to bulb suction.  All counts were found to be correct.  The patient was awoke extubated taken to recovery in satisfactory condition.

## 2023-03-30 NOTE — Progress Notes (Signed)
PROGRESS NOTE    Nicholas Moody  ZOX:[id number]  DOB: 1956/05/03  DOA: 03/23/2023 PCP: Nicholas Ng, NP Outpatient Specialists:   Hospital course: 68 year old man with HTN who was admitted for acute appendicitis with 2X 1 cm periappendiceal abscess. Patient was seen by general surgery who were recommending conservative management with IV antibiotics. Due to worsening symptoms, with SBO, surgery recommended for lap appendectomy with drainage of pelvic abscess on 10/11.    Subjective: Saw patient after surgery, drain and NGT noted in place. Denies any new complaints, post op pain controlled.   Objective: Vitals:   03/30/23 1250 03/30/23 1300 03/30/23 1315 03/30/23 1408  BP: (!) 141/98 (!) 138/91 (!) 146/96 (!) 130/95  Pulse: (!) 104 (!) 111 (!) 101 (!) 113  Resp: (!) 22 (!) 31 17   Temp: 98.4 F (36.9 C)  98.7 F (37.1 C) 97.9 F (36.6 C)  TempSrc:    Oral  SpO2: 96% 92% 96% 99%  Weight:      Height:        Intake/Output Summary (Last 24 hours) at 03/30/2023 1422 Last data filed at 03/30/2023 1250 Gross per 24 hour  Intake 899.94 ml  Output 1875 ml  Net -975.06 ml   Filed Weights   03/23/23 1827 03/24/23 0428 03/30/23 0939  Weight: 82.1 kg 79.4 kg 79.4 kg     Exam: General: NAD, ngt in place Cardiovascular: S1, S2 present Respiratory: CTAB Abdomen: Soft, tender, nondistended, bowel sounds present, drain noted Musculoskeletal: No bilateral pedal edema noted Skin: Normal Psychiatry: Normal mood      Data Reviewed:  Basic Metabolic Panel: Recent Labs  Lab 03/24/23 0646 03/25/23 0644 03/26/23 0902 03/27/23 0431 03/28/23 0431 03/30/23 0502  NA 132* 133* 128* 129* 134* 134*  K 3.2* 3.6 3.2* 3.9 4.1 3.1*  CL 96* 95* 91* 95* 97* 91*  CO2 21* 25 25 25 27 25   GLUCOSE 115* 126* 111* 102* 103* 104*  BUN 14 13 15 15 12 21   CREATININE 1.17 1.07 0.98 1.02 1.12 1.34*  CALCIUM 8.7* 8.6* 8.3* 8.2* 8.9 9.3  MG 1.7 1.9  --   --  2.0  --   PHOS 2.6   --   --   --  2.6  --     CBC: Recent Labs  Lab 03/24/23 0646 03/25/23 0644 03/26/23 0902 03/27/23 0917 03/28/23 0431 03/29/23 0512 03/30/23 0502  WBC 26.7* 21.5* 18.6* 14.7* 14.0* 14.3* 17.5*  NEUTROABS 22.4* 18.7* 15.3*  --   --   --  13.4*  HGB 12.9* 15.1 14.0 12.7* 13.0 13.2 14.5  HCT 35.5* 41.9 38.4* 34.8* 36.3* 36.7* 41.1  MCV 90.3 90.9 91.0 89.7 92.4 92.2 89.5  PLT 277 309 332 337 348 382 476*     Scheduled Meds:  heparin  5,000 Units Subcutaneous Q8H   HYDROmorphone       levothyroxine  125 mcg Oral Q0600   pantoprazole (PROTONIX) IV  40 mg Intravenous Daily   Or   pantoprazole  40 mg Oral Daily   Continuous Infusions:  lactated ringers 100 mL/hr at 03/30/23 1410   piperacillin-tazobactam 3.375 g (03/30/23 1413)   potassium chloride 10 mEq (03/30/23 0956)     Assessment & Plan:   Acute perforated appendicitis with localized abscess complicated by SBO S/p lap appendectomy with drainage of pelvic abscess on 10/11 Currently afebrile with persistent leukocytosis Continue IV Zosyn Repeat CT scan, showed possible SBO Gen surg on board, s/p lap appendectomy as noted above  Daily CBC, pain management  Mild AKI Likely 2/2 above Start IVF Hold PTA lisinopril Daily BMP  Hypokalemia Replaced prn  HTN Hold lisinopril Labetalol, hydralazine as needed  Hypothyroidism Continue Synthroid    DVT prophylaxis: Subcu heparin Code Status: Full Family Communication: none      Studies: DG Abd Portable 1V-Small Bowel Obstruction Protocol-initial, 8 hr delay  Result Date: 03/30/2023 CLINICAL DATA:  Encounter for small bowel obstruction. EXAM: PORTABLE ABDOMEN - 1 VIEW COMPARISON:  03/29/2023 FINDINGS: Nasogastric tube tip is in the left upper abdomen and gastric fundus region. Again noted are dilated loops of small bowel in the abdomen and the bowel gas distension has not significantly changed from the prior examination. Again noted is oral contrast in the  right colon. Again noted are multiple pelvic calcifications. 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 Limited evaluation for free air on the supine images. IMPRESSION: 1. Nasogastric tube tip is in the left upper abdomen and gastric fundus region. 2. Persistent dilated loops of small bowel in the abdomen. Findings are compatible with a small bowel obstruction. Small bowel distension has not significantly changed since 03/29/2023. Electronically Signed   By: Nicholas Moody M.D.   On: 03/30/2023 09:21   DG Abd Portable 1V-Small Bowel Protocol-Position Verification  Result Date: 03/29/2023 CLINICAL DATA:  Nasogastric tube placement. EXAM: PORTABLE ABDOMEN - 1 VIEW COMPARISON:  Abdominal radiograph earlier today. FINDINGS: Tip and side port of the enteric tube below the diaphragm in the stomach. Similar gaseous bowel distension centrally. IMPRESSION: Tip and side port of the enteric tube below the diaphragm in the stomach. Electronically Signed   By: Nicholas Moody M.D.   On: 03/29/2023 17:27   DG Abd Portable 1V  Result Date: 03/29/2023 CLINICAL DATA:  Ileus. EXAM: PORTABLE ABDOMEN - 1 VIEW COMPARISON:  March 12, 2013. FINDINGS: Dilated small bowel loops are noted concerning for ileus or distal small bowel obstruction. Contrast is noted in nondilated colon. Phleboliths are noted in the pelvis. IMPRESSION: Small bowel dilatation is noted concerning for ileus or distal small bowel obstruction. Electronically Signed   By: Nicholas Moody M.D.   On: 03/29/2023 12:25    Principal Problem:   Acute appendicitis     Nicholas Moody,    LOS: 6 days

## 2023-03-30 NOTE — Progress Notes (Signed)
Subjective/Chief Complaint: Patient with more distention and now has an NG tube.  Plain films show small bowel obstruction.  Discussed surgical intervention at this point since his condition is not improving with medical management.   Objective: Vital signs in last 24 hours: Temp:  [98.1 F (36.7 C)-98.5 F (36.9 C)] 98.5 F (36.9 C) (10/11 0600) Pulse Rate:  [98-100] 100 (10/11 0600) Resp:  [20] 20 (10/10 1644) BP: (136-158)/(99-102) 136/99 (10/11 0600) SpO2:  [97 %-99 %] 97 % (10/11 0600) Last BM Date : 03/27/23  Intake/Output from previous day: 10/10 0701 - 10/11 0700 In: 355.3 [P.O.:220; IV Piggyback:135.3] Out: 1625 [Emesis/NG output:1625] Intake/Output this shift: No intake/output data recorded.  Abdomen: Distended tender further quadrant no evidence of diffuse peritonitis.  NG tube in place  Lab Results:  Recent Labs    03/29/23 0512 03/30/23 0502  WBC 14.3* 17.5*  HGB 13.2 14.5  HCT 36.7* 41.1  PLT 382 476*   BMET Recent Labs    03/28/23 0431 03/30/23 0502  NA 134* 134*  K 4.1 3.1*  CL 97* 91*  CO2 27 25  GLUCOSE 103* 104*  BUN 12 21  CREATININE 1.12 1.34*  CALCIUM 8.9 9.3   PT/INR No results for input(s): "LABPROT", "INR" in the last 72 hours. ABG No results for input(s): "PHART", "HCO3" in the last 72 hours.  Invalid input(s): "PCO2", "PO2"  Studies/Results: DG Abd Portable 1V-Small Bowel Protocol-Position Verification  Result Date: 03/29/2023 CLINICAL DATA:  Nasogastric tube placement. EXAM: PORTABLE ABDOMEN - 1 VIEW COMPARISON:  Abdominal radiograph earlier today. FINDINGS: Tip and side port of the enteric tube below the diaphragm in the stomach. Similar gaseous bowel distension centrally. IMPRESSION: Tip and side port of the enteric tube below the diaphragm in the stomach. Electronically Signed   By: Narda Rutherford M.D.   On: 03/29/2023 17:27   DG Abd Portable 1V  Result Date: 03/29/2023 CLINICAL DATA:  Ileus. EXAM: PORTABLE  ABDOMEN - 1 VIEW COMPARISON:  March 12, 2013. FINDINGS: Dilated small bowel loops are noted concerning for ileus or distal small bowel obstruction. Contrast is noted in nondilated colon. Phleboliths are noted in the pelvis. IMPRESSION: Small bowel dilatation is noted concerning for ileus or distal small bowel obstruction. Electronically Signed   By: Lupita Raider M.D.   On: 03/29/2023 12:25    Anti-infectives: Anti-infectives (From admission, onward)    Start     Dose/Rate Route Frequency Ordered Stop   03/24/23 0645  piperacillin-tazobactam (ZOSYN) IVPB 3.375 g        3.375 g 12.5 mL/hr over 240 Minutes Intravenous Every 8 hours 03/24/23 0547     03/23/23 2245  piperacillin-tazobactam (ZOSYN) IVPB 3.375 g        3.375 g 100 mL/hr over 30 Minutes Intravenous  Once 03/23/23 2235 03/23/23 2323       Assessment/Plan: Perforated appendicitis with abscess and ileus vs SBO    Patient has failed medical management.  He now is a bowel obstruction and requires surgical intervention.  I recommend laparoscopy with possible laparotomy, appendectomy, partial colectomy.  He understands that this may need to be done depending on intraoperative findings.  Risks and benefits of surgery reviewed as well as continuing medical management.  There is no role for IR drainage at this point to his bowel obstruction.The procedure has been discussed with the patient.  Alternative therapies have been discussed with the patient.  Operative risks include bleeding,  Infection,  Organ injury,  Nerve injury,  Blood vessel injury, abscess, anastomotic breakdown, hernia, wound complications, DVT,  Pulmonary embolism,  Death,  And possible reoperation.  Medical management risks include worsening of present situation.  The success of the procedure is 50 -90 % at treating patients symptoms.  The patient understands and agrees to proceed.   LOS: 6 days    Dortha Schwalbe 03/30/2023 High complexity

## 2023-03-30 NOTE — Transfer of Care (Signed)
Immediate Anesthesia Transfer of Care Note  Patient: [name]  Procedure(s) Performed: LAPAROSCOPY DIAGNOSTIC APPENDECTOMY LAPAROSCOPIC  Patient Location: PACU  Anesthesia Type:General  Level of Consciousness: awake and alert   Airway & Oxygen Therapy: Patient Spontanous Breathing  Post-op Assessment: Report given to RN and Post -op Vital signs reviewed and stable  Post vital signs: Reviewed and stable  Last Vitals:  Vitals Value Taken Time  BP 141/98 03/30/23 1248  Temp    Pulse 105 03/30/23 1250  Resp 24 03/30/23 1250  SpO2 95 % 03/30/23 1250  Vitals shown include unfiled device data.  Last Pain:  Vitals:   03/30/23 0947  TempSrc:   PainSc: 0-No pain      Patients Stated Pain Goal: 2 (03/28/23 1934)  Complications: No notable events documented.

## 2023-03-31 ENCOUNTER — Encounter (HOSPITAL_COMMUNITY): Payer: Self-pay | Admitting: Surgery

## 2023-03-31 DIAGNOSIS — K3589 Other acute appendicitis without perforation or gangrene: Secondary | ICD-10-CM | POA: Diagnosis not present

## 2023-03-31 LAB — CBC WITH DIFFERENTIAL/PLATELET
Abs Immature Granulocytes: 0.26 K/uL — ABNORMAL HIGH (ref 0.00–0.07)
Basophils Absolute: 0.1 K/uL (ref 0.0–0.1)
Basophils Relative: 0 %
Eosinophils Absolute: 0.1 K/uL (ref 0.0–0.5)
Eosinophils Relative: 0 %
HCT: 37.6 % — ABNORMAL LOW (ref 39.0–52.0)
Hemoglobin: 13.3 g/dL (ref 13.0–17.0)
Immature Granulocytes: 1 %
Lymphocytes Relative: 13 %
Lymphs Abs: 3.1 K/uL (ref 0.7–4.0)
MCH: 32 pg (ref 26.0–34.0)
MCHC: 35.4 g/dL (ref 30.0–36.0)
MCV: 90.4 fL (ref 80.0–100.0)
Monocytes Absolute: 2.6 K/uL — ABNORMAL HIGH (ref 0.1–1.0)
Monocytes Relative: 11 %
Neutro Abs: 17.3 K/uL — ABNORMAL HIGH (ref 1.7–7.7)
Neutrophils Relative %: 75 %
Platelets: 454 K/uL — ABNORMAL HIGH (ref 150–400)
RBC: 4.16 MIL/uL — ABNORMAL LOW (ref 4.22–5.81)
RDW: 11.8 % (ref 11.5–15.5)
WBC: 23.4 K/uL — ABNORMAL HIGH (ref 4.0–10.5)
nRBC: 0 % (ref 0.0–0.2)

## 2023-03-31 LAB — MAGNESIUM: Magnesium: 2.1 mg/dL (ref 1.7–2.4)

## 2023-03-31 LAB — BASIC METABOLIC PANEL
Anion gap: 13 (ref 5–15)
BUN: 26 mg/dL — ABNORMAL HIGH (ref 8–23)
CO2: 24 mmol/L (ref 22–32)
Calcium: 8.4 mg/dL — ABNORMAL LOW (ref 8.9–10.3)
Chloride: 97 mmol/L — ABNORMAL LOW (ref 98–111)
Creatinine, Ser: 1.07 mg/dL (ref 0.61–1.24)
GFR, Estimated: >60 mL/min (ref >60)
Glucose, Bld: 92 mg/dL (ref 70–99)
Potassium: 3.4 mmol/L — ABNORMAL LOW (ref 3.5–5.1)
Sodium: 134 mmol/L — ABNORMAL LOW (ref 135–145)

## 2023-03-31 MED ORDER — LACTATED RINGERS IV SOLN: INTRAVENOUS | Status: DC

## 2023-03-31 MED ORDER — POTASSIUM CHLORIDE 10 MEQ/100ML IV SOLN
10.0000 meq | INTRAVENOUS | Status: AC
  Administered 2023-03-31 (×4): 10 meq via INTRAVENOUS
  Filled 2023-03-31 (×4): qty 100

## 2023-03-31 NOTE — Plan of Care (Signed)
Problem: Clinical Measurements: Goal: Ability to maintain clinical measurements within normal limits will improve Outcome: Progressing Goal: Will remain free from infection Outcome: Progressing   Problem: Activity: Goal: Risk for activity intolerance will decrease Outcome: Progressing   Problem: Nutrition: Goal: Adequate nutrition will be maintained Outcome: Progressing   Problem: Pain Managment: Goal: General experience of comfort will improve Outcome: Progressing   Problem: Safety: Goal: Ability to remain free from injury will improve Outcome: Progressing   Problem: Skin Integrity: Goal: Risk for impaired skin integrity will decrease Outcome: Progressing

## 2023-03-31 NOTE — Plan of Care (Signed)
  Problem: Health Behavior/Discharge Planning: Goal: Ability to manage health-related needs will improve Outcome: Progressing   

## 2023-03-31 NOTE — Progress Notes (Signed)
PROGRESS NOTE    Nicholas Moody  JXB:[id number]  DOB: [birth year]  DOA: 03/23/2023 PCP: Nicholas Ng, NP Outpatient Specialists:   Hospital course: 67 year old man with HTN who was admitted for acute appendicitis with 2X 1 cm periappendiceal abscess. Patient was seen by general surgery who were recommending conservative management with IV antibiotics. Due to worsening symptoms, with SBO, surgery recommended for lap appendectomy with drainage of pelvic abscess on 10/11.    Subjective: Pt reported controlled post op pain. NGT in place. No new complaints   Objective: Vitals:   03/30/23 2019 03/31/23 0041 03/31/23 0359 03/31/23 0908  BP: (!) 125/92 (!) 145/88 129/86 126/87  Pulse: 89 79 82 (!) 102  Resp:      Temp: 97.9 F (36.6 C) 98.1 F (36.7 C) 98.4 F (36.9 C) 98.3 F (36.8 C)  TempSrc: Oral Oral Oral   SpO2: 98% 97% 96% 98%  Weight:      Height:        Intake/Output Summary (Last 24 hours) at 03/31/2023 1801 Last data filed at 03/31/2023 0900 Gross per 24 hour  Intake 0 ml  Output 600 ml  Net -600 ml   Filed Weights   03/23/23 1827 03/24/23 0428 03/30/23 0939  Weight: 82.1 kg 79.4 kg 79.4 kg     Exam: General: NAD, ngt in place Cardiovascular: S1, S2 present Respiratory: CTAB Abdomen: Soft, tender, nondistended, no bowel sounds present, drain noted Musculoskeletal: No bilateral pedal edema noted Skin: Normal Psychiatry: Normal mood      Data Reviewed:  Basic Metabolic Panel: Recent Labs  Lab 03/25/23 0644 03/26/23 0902 03/27/23 0431 03/28/23 0431 03/30/23 0502 03/31/23 0329 03/31/23 0332  NA 133* 128* 129* 134* 134*  --  134*  K 3.6 3.2* 3.9 4.1 3.1*  --  3.4*  CL 95* 91* 95* 97* 91*  --  97*  CO2 25 25 25 27 25   --  24  GLUCOSE 126* 111* 102* 103* 104*  --  92  BUN 13 15 15 12 21   --  26*  CREATININE 1.07 0.98 1.02 1.12 1.34*  --  1.07  CALCIUM 8.6* 8.3* 8.2* 8.9 9.3  --  8.4*  MG 1.9  --   --  2.0  --  2.1  --   PHOS  --    --   --  2.6  --   --   --     CBC: Recent Labs  Lab 03/25/23 0644 03/26/23 0902 03/27/23 0917 03/28/23 0431 03/29/23 0512 03/30/23 0502 03/31/23 0332  WBC 21.5* 18.6* 14.7* 14.0* 14.3* 17.5* 23.4*  NEUTROABS 18.7* 15.3*  --   --   --  13.4* 17.3*  HGB 15.1 14.0 12.7* 13.0 13.2 14.5 13.3  HCT 41.9 38.4* 34.8* 36.3* 36.7* 41.1 37.6*  MCV 90.9 91.0 89.7 92.4 92.2 89.5 90.4  PLT 309 332 337 348 382 476* 454*     Scheduled Meds:  heparin  5,000 Units Subcutaneous Q8H   levothyroxine  125 mcg Oral Q0600   pantoprazole (PROTONIX) IV  40 mg Intravenous Daily   Or   pantoprazole  40 mg Oral Daily   Continuous Infusions:  piperacillin-tazobactam 3.375 g (03/31/23 1304)     Assessment & Plan:   Acute perforated appendicitis with localized abscess complicated by SBO S/p lap appendectomy with drainage of pelvic abscess on 10/11 Currently afebrile with persistent leukocytosis Continue IV Zosyn Repeat CT scan, showed possible SBO Gen surg on board, s/p lap appendectomy as noted  above NPO, IVF Daily CBC, pain management  Mild AKI Improving Likely 2/2 above Continue IVF Hold PTA lisinopril Daily BMP  Hypokalemia Replaced prn  HTN Hold lisinopril Labetalol, hydralazine as needed  Hypothyroidism Continue Synthroid    DVT prophylaxis: Subcu heparin Code Status: Full Family Communication: none      Studies: DG Abd Portable 1V-Small Bowel Obstruction Protocol-initial, 8 hr delay  Result Date: 03/30/2023 CLINICAL DATA:  Encounter for small bowel obstruction. EXAM: PORTABLE ABDOMEN - 1 VIEW COMPARISON:  03/29/2023 FINDINGS: Nasogastric tube tip is in the left upper abdomen and gastric fundus region. Again noted are dilated loops of small bowel in the abdomen and the bowel gas distension has not significantly changed from the prior examination. Again noted is oral contrast in the right colon. Again noted are multiple pelvic calcifications. Multiple colonic  diverticula are noted in the left abdomen. Limited evaluation for free air on the supine images. IMPRESSION: 1. Nasogastric tube tip is in the left upper abdomen and gastric fundus region. 2. Persistent dilated loops of small bowel in the abdomen. Findings are compatible with a small bowel obstruction. Small bowel distension has not significantly changed since 03/29/2023. Electronically Signed   By: Nicholas Moody M.D.   On: 03/30/2023 09:21    Principal Problem:   Acute appendicitis     Nicholas Moody,    LOS: 7 days

## 2023-03-31 NOTE — Plan of Care (Signed)
  Problem: Health Behavior/Discharge Planning: Goal: Ability to manage health-related needs will improve Outcome: Progressing   Problem: Clinical Measurements: Goal: Ability to maintain clinical measurements within normal limits will improve Outcome: Progressing Goal: Will remain free from infection Outcome: Progressing   Problem: Activity: Goal: Risk for activity intolerance will decrease Outcome: Progressing   Problem: Nutrition: Goal: Adequate nutrition will be maintained Outcome: Not Progressing   Problem: Coping: Goal: Level of anxiety will decrease Outcome: Not Met (add Reason) Note: NGT and NPO   Problem: Elimination: Goal: Will not experience complications related to bowel motility Outcome: Progressing Goal: Will not experience complications related to urinary retention Outcome: Progressing   Problem: Pain Managment: Goal: General experience of comfort will improve Outcome: Progressing   Problem: Safety: Goal: Ability to remain free from injury will improve Outcome: Progressing   Problem: Skin Integrity: Goal: Risk for impaired skin integrity will decrease Outcome: Progressing   Problem: Fluid Volume: Goal: Hemodynamic stability will improve Outcome: Progressing   Problem: Clinical Measurements: Goal: Diagnostic test results will improve Outcome: Progressing Goal: Signs and symptoms of infection will decrease Outcome: Progressing   Problem: Respiratory: Goal: Ability to maintain adequate ventilation will improve Outcome: Progressing

## 2023-03-31 NOTE — Progress Notes (Signed)
1 Day Post-Op   Subjective/Chief Complaint: Comfortable this morning Abdominal pain improved post op No flatus yet   Objective: Vital signs in last 24 hours: Temp:  [97.9 F (36.6 C)-98.7 F (37.1 C)] 98.3 F (36.8 C) (10/12 0908) Pulse Rate:  [79-111] 102 (10/12 0908) Resp:  [16-31] 16 (10/11 1428) BP: (125-146)/(86-98) 126/87 (10/12 0908) SpO2:  [92 %-99 %] 98 % (10/12 0908) Weight:  [79.4 kg] 79.4 kg (10/11 0939) Last BM Date : 03/27/23  Intake/Output from previous day: 10/11 0701 - 10/12 0700 In: 868.8 [I.V.:700; IV Piggyback:168.8] Out: 650 [Urine:325; Emesis/NG output:50; Drains:250; Blood:25] Intake/Output this shift: No intake/output data recorded.  Exam: Awake and alert NG in place Abdomen soft, minimally tender, drain serosang  Lab Results:  Recent Labs    03/30/23 0502 03/31/23 0332  WBC 17.5* 23.4*  HGB 14.5 13.3  HCT 41.1 37.6*  PLT 476* 454*   BMET Recent Labs    03/30/23 0502 03/31/23 0332  NA 134* 134*  K 3.1* 3.4*  CL 91* 97*  CO2 25 24  GLUCOSE 104* 92  BUN 21 26*  CREATININE 1.34* 1.07  CALCIUM 9.3 8.4*   PT/INR No results for input(s): "LABPROT", "INR" in the last 72 hours. ABG No results for input(s): "PHART", "HCO3" in the last 72 hours.  Invalid input(s): "PCO2", "PO2"  Studies/Results: DG Abd Portable 1V-Small Bowel Obstruction Protocol-initial, 8 hr delay  Result Date: 03/30/2023 CLINICAL DATA:  Encounter for small bowel obstruction. EXAM: PORTABLE ABDOMEN - 1 VIEW COMPARISON:  03/29/2023 FINDINGS: Nasogastric tube tip is in the left upper abdomen and gastric fundus region. Again noted are dilated loops of small bowel in the abdomen and the bowel gas distension has not significantly changed from the prior examination. Again noted is oral contrast in the right colon. Again noted are multiple pelvic calcifications. 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 Limited evaluation for free air on the supine images.  IMPRESSION: 1. Nasogastric tube tip is in the left upper abdomen and gastric fundus region. 2. Persistent dilated loops of small bowel in the abdomen. Findings are compatible with a small bowel obstruction. Small bowel distension has not significantly changed since 03/29/2023. Electronically Signed   By: Richarda Overlie M.D.   On: 03/30/2023 09:21   DG Abd Portable 1V-Small Bowel Protocol-Position Verification  Result Date: 03/29/2023 CLINICAL DATA:  Nasogastric tube placement. EXAM: PORTABLE ABDOMEN - 1 VIEW COMPARISON:  Abdominal radiograph earlier today. FINDINGS: Tip and side port of the enteric tube below the diaphragm in the stomach. Similar gaseous bowel distension centrally. IMPRESSION: Tip and side port of the enteric tube below the diaphragm in the stomach. Electronically Signed   By: Narda Rutherford M.D.   On: 03/29/2023 17:27   DG Abd Portable 1V  Result Date: 03/29/2023 CLINICAL DATA:  Ileus. EXAM: PORTABLE ABDOMEN - 1 VIEW COMPARISON:  March 12, 2013. FINDINGS: Dilated small bowel loops are noted concerning for ileus or distal small bowel obstruction. Contrast is noted in nondilated colon. Phleboliths are noted in the pelvis. IMPRESSION: Small bowel dilatation is noted concerning for ileus or distal small bowel obstruction. Electronically Signed   By: Lupita Raider M.D.   On: 03/29/2023 12:25    Anti-infectives: Anti-infectives (From admission, onward)    Start     Dose/Rate Route Frequency Ordered Stop   03/24/23 0645  piperacillin-tazobactam (ZOSYN) IVPB 3.375 g        3.375 g 12.5 mL/hr over 240 Minutes Intravenous Every 8 hours  03/24/23 0547     03/23/23 2245  piperacillin-tazobactam (ZOSYN) IVPB 3.375 g        3.375 g 100 mL/hr over 30 Minutes Intravenous  Once 03/23/23 2235 03/23/23 2323       Assessment/Plan: POD 1 s/p laparoscopic appendectomy for appendicitis 10/11 TC  -suspect WBC is reactionary.  Continue antibiotics and repeat in the morning -continue NG  given preop ileus/bowel dilation -continue drain  Abigail Miyamoto MD 03/31/2023

## 2023-04-01 DIAGNOSIS — K3589 Other acute appendicitis without perforation or gangrene: Secondary | ICD-10-CM | POA: Diagnosis not present

## 2023-04-01 LAB — CBC WITH DIFFERENTIAL/PLATELET
Abs Immature Granulocytes: 0.31 10*3/uL — ABNORMAL HIGH (ref 0.00–0.07)
Basophils Absolute: 0.1 10*3/uL (ref 0.0–0.1)
Basophils Relative: 1 %
Eosinophils Absolute: 0.3 10*3/uL (ref 0.0–0.5)
Eosinophils Relative: 2 %
HCT: 34.9 % — ABNORMAL LOW (ref 39.0–52.0)
Hemoglobin: 12.6 g/dL — ABNORMAL LOW (ref 13.0–17.0)
Immature Granulocytes: 2 %
Lymphocytes Relative: 13 %
Lymphs Abs: 2.1 10*3/uL (ref 0.7–4.0)
MCH: 34 pg (ref 26.0–34.0)
MCHC: 36.1 g/dL — ABNORMAL HIGH (ref 30.0–36.0)
MCV: 94.1 fL (ref 80.0–100.0)
Monocytes Absolute: 1.4 10*3/uL — ABNORMAL HIGH (ref 0.1–1.0)
Monocytes Relative: 8 %
Neutro Abs: 12.3 10*3/uL — ABNORMAL HIGH (ref 1.7–7.7)
Neutrophils Relative %: 74 %
Platelets: 429 10*3/uL — ABNORMAL HIGH (ref 150–400)
RBC: 3.71 MIL/uL — ABNORMAL LOW (ref 4.22–5.81)
RDW: 11.9 % (ref 11.5–15.5)
WBC: 16.5 10*3/uL — ABNORMAL HIGH (ref 4.0–10.5)
nRBC: 0 % (ref 0.0–0.2)

## 2023-04-01 LAB — BASIC METABOLIC PANEL
Anion gap: 10 (ref 5–15)
BUN: 24 mg/dL — ABNORMAL HIGH (ref 8–23)
CO2: 24 mmol/L (ref 22–32)
Calcium: 8.1 mg/dL — ABNORMAL LOW (ref 8.9–10.3)
Chloride: 100 mmol/L (ref 98–111)
Creatinine, Ser: 1.08 mg/dL (ref 0.61–1.24)
GFR, Estimated: >60 mL/min (ref >60)
Glucose, Bld: 85 mg/dL (ref 70–99)
Potassium: 3.8 mmol/L (ref 3.5–5.1)
Sodium: 134 mmol/L — ABNORMAL LOW (ref 135–145)

## 2023-04-01 LAB — GLUCOSE, CAPILLARY: Glucose-Capillary: 87 mg/dL (ref 70–99)

## 2023-04-01 NOTE — Progress Notes (Signed)
PROGRESS NOTE    Nicholas Moody  QMV:[id number]  DOB: 09/29/1955  DOA: 03/23/2023 PCP: Anne Ng, NP Outpatient Specialists:   Hospital course: 67 year old man with HTN who was admitted for acute appendicitis with 2X 1 cm periappendiceal abscess. Patient was seen by general surgery who were recommending conservative management with IV antibiotics. Due to worsening symptoms, with SBO, surgery recommended for lap appendectomy with drainage of pelvic abscess on 10/11.    Subjective: Pt denies any new complaints. Post op pain is controlled, passing gas. NGT removed. Advanced to FLD   Objective: Vitals:   03/31/23 0908 03/31/23 1808 04/01/23 0910 04/01/23 1615  BP: 126/87 136/80 130/76 (!) 141/84  Pulse: (!) 102 93 85 89  Resp:   18 (!) 21  Temp: 98.3 F (36.8 C) 98.5 F (36.9 C) 98 F (36.7 C) 98.5 F (36.9 C)  TempSrc:    Oral  SpO2: 98% 99% 98% 99%  Weight:      Height:        Intake/Output Summary (Last 24 hours) at 04/01/2023 1853 Last data filed at 04/01/2023 1700 Gross per 24 hour  Intake 946.25 ml  Output 136 ml  Net 810.25 ml   Filed Weights   03/23/23 1827 03/24/23 0428 03/30/23 0939  Weight: 82.1 kg 79.4 kg 79.4 kg     Exam: General: NAD Cardiovascular: S1, S2 present Respiratory: CTAB Abdomen: Soft, tender, nondistended, +bowel sounds present, drain noted Musculoskeletal: No bilateral pedal edema noted Skin: Normal Psychiatry: Normal mood      Data Reviewed:  Basic Metabolic Panel: Recent Labs  Lab 03/27/23 0431 03/28/23 0431 03/30/23 0502 03/31/23 0329 03/31/23 0332 04/01/23 0904  NA 129* 134* 134*  --  134* 134*  K 3.9 4.1 3.1*  --  3.4* 3.8  CL 95* 97* 91*  --  97* 100  CO2 25 27 25   --  24 24  GLUCOSE 102* 103* 104*  --  92 85  BUN 15 12 21   --  26* 24*  CREATININE 1.02 1.12 1.34*  --  1.07 1.08  CALCIUM 8.2* 8.9 9.3  --  8.4* 8.1*  MG  --  2.0  --  2.1  --   --   PHOS  --  2.6  --   --   --   --      CBC: Recent Labs  Lab 03/26/23 0902 03/27/23 0917 03/28/23 0431 03/29/23 0512 03/30/23 0502 03/31/23 0332 04/01/23 0904  WBC 18.6*   < > 14.0* 14.3* 17.5* 23.4* 16.5*  NEUTROABS 15.3*  --   --   --  13.4* 17.3* 12.3*  HGB 14.0   < > 13.0 13.2 14.5 13.3 12.6*  HCT 38.4*   < > 36.3* 36.7* 41.1 37.6* 34.9*  MCV 91.0   < > 92.4 92.2 89.5 90.4 94.1  PLT 332   < > 348 382 476* 454* 429*   < > = values in this interval not displayed.     Scheduled Meds:  heparin  5,000 Units Subcutaneous Q8H   levothyroxine  125 mcg Oral Q0600   pantoprazole (PROTONIX) IV  40 mg Intravenous Daily   Or   pantoprazole  40 mg Oral Daily   Continuous Infusions:  lactated ringers Stopped (04/01/23 1645)   piperacillin-tazobactam 3.375 g (04/01/23 1418)     Assessment & Plan:   Acute perforated appendicitis with localized abscess complicated by SBO S/p lap appendectomy with drainage of pelvic abscess on 10/11 Currently afebrile with  resolving leukocytosis Continue IV Zosyn Repeat CT scan, showed possible SBO Gen surg on board, s/p lap appendectomy as noted above FLD, stopped IVF Daily CBC, pain management  Mild AKI Improving Likely 2/2 above S/p IVF Hold PTA lisinopril Daily BMP  Hypokalemia Replaced prn  HTN Hold lisinopril Labetalol, hydralazine as needed  Hypothyroidism Continue Synthroid    DVT prophylaxis: Subcu heparin Code Status: Full Family Communication: none      Studies: No results found.  Principal Problem:   Acute appendicitis     Briant Cedar,    LOS: 8 days

## 2023-04-01 NOTE — Plan of Care (Signed)
Problem: Clinical Measurements: Goal: Ability to maintain clinical measurements within normal limits will improve Outcome: Progressing Goal: Will remain free from infection Outcome: Progressing   Problem: Nutrition: Goal: Adequate nutrition will be maintained Outcome: Progressing   Problem: Coping: Goal: Level of anxiety will decrease Outcome: Progressing   Problem: Pain Managment: Goal: General experience of comfort will improve Outcome: Progressing   Problem: Safety: Goal: Ability to remain free from injury will improve Outcome: Progressing   Problem: Skin Integrity: Goal: Risk for impaired skin integrity will decrease Outcome: Progressing

## 2023-04-01 NOTE — Progress Notes (Signed)
2 Days Post-Op   Subjective/Chief Complaint: No complaint Passing flatus Minimal out of the NG   Objective: Vital signs in last 24 hours: Temp:  [98.3 F (36.8 C)-98.5 F (36.9 C)] 98.5 F (36.9 C) (10/12 1808) Pulse Rate:  [93-102] 93 (10/12 1808) BP: (126-136)/(80-87) 136/80 (10/12 1808) SpO2:  [98 %-99 %] 99 % (10/12 1808) Last BM Date : 03/27/23  Intake/Output from previous day: 10/12 0701 - 10/13 0700 In: 946.3 [I.V.:808.4; IV Piggyback:137.8] Out: 485 [Urine:300; Emesis/NG output:150; Drains:35] Intake/Output this shift: No intake/output data recorded.  Exam: Awake and alert Comfortable Abdomen soft, non-distended today Drain serosang  Lab Results:  Recent Labs    03/30/23 0502 03/31/23 0332  WBC 17.5* 23.4*  HGB 14.5 13.3  HCT 41.1 37.6*  PLT 476* 454*   BMET Recent Labs    03/30/23 0502 03/31/23 0332  NA 134* 134*  K 3.1* 3.4*  CL 91* 97*  CO2 25 24  GLUCOSE 104* 92  BUN 21 26*  CREATININE 1.34* 1.07  CALCIUM 9.3 8.4*   PT/INR No results for input(s): "LABPROT", "INR" in the last 72 hours. ABG No results for input(s): "PHART", "HCO3" in the last 72 hours.  Invalid input(s): "PCO2", "PO2"  Studies/Results: No results found.  Anti-infectives: Anti-infectives (From admission, onward)    Start     Dose/Rate Route Frequency Ordered Stop   03/24/23 0645  piperacillin-tazobactam (ZOSYN) IVPB 3.375 g        3.375 g 12.5 mL/hr over 240 Minutes Intravenous Every 8 hours 03/24/23 0547     03/23/23 2245  piperacillin-tazobactam (ZOSYN) IVPB 3.375 g        3.375 g 100 mL/hr over 30 Minutes Intravenous  Once 03/23/23 2235 03/23/23 2323       Assessment/Plan: POD 1 s/p laparoscopic appendectomy for appendicitis 10/11 TC   -d/c NG -start full liquid diet  Abigail Miyamoto MD 04/01/2023

## 2023-04-01 NOTE — Plan of Care (Signed)
Problem: Health Behavior/Discharge Planning: Goal: Ability to manage health-related needs will improve Outcome: Completed/Met

## 2023-04-02 DIAGNOSIS — K3589 Other acute appendicitis without perforation or gangrene: Secondary | ICD-10-CM | POA: Diagnosis not present

## 2023-04-02 LAB — BASIC METABOLIC PANEL
Anion gap: 19 — ABNORMAL HIGH (ref 5–15)
BUN: 19 mg/dL (ref 8–23)
CO2: 24 mmol/L (ref 22–32)
Calcium: 9.3 mg/dL (ref 8.9–10.3)
Chloride: 95 mmol/L — ABNORMAL LOW (ref 98–111)
Creatinine, Ser: 1.27 mg/dL — ABNORMAL HIGH (ref 0.61–1.24)
GFR, Estimated: >60 mL/min (ref >60)
Glucose, Bld: 89 mg/dL (ref 70–99)
Potassium: 3.9 mmol/L (ref 3.5–5.1)
Sodium: 138 mmol/L (ref 135–145)

## 2023-04-02 LAB — CBC WITH DIFFERENTIAL/PLATELET
Abs Immature Granulocytes: 0.17 10*3/uL — ABNORMAL HIGH (ref 0.00–0.07)
Basophils Absolute: 0.1 10*3/uL (ref 0.0–0.1)
Basophils Relative: 1 %
Eosinophils Absolute: 0.7 10*3/uL — ABNORMAL HIGH (ref 0.0–0.5)
Eosinophils Relative: 4 %
HCT: 34.7 % — ABNORMAL LOW (ref 39.0–52.0)
Hemoglobin: 12.1 g/dL — ABNORMAL LOW (ref 13.0–17.0)
Immature Granulocytes: 1 %
Lymphocytes Relative: 16 %
Lymphs Abs: 2.5 10*3/uL (ref 0.7–4.0)
MCH: 32.2 pg (ref 26.0–34.0)
MCHC: 34.9 g/dL (ref 30.0–36.0)
MCV: 92.3 fL (ref 80.0–100.0)
Monocytes Absolute: 1.3 10*3/uL — ABNORMAL HIGH (ref 0.1–1.0)
Monocytes Relative: 8 %
Neutro Abs: 11 10*3/uL — ABNORMAL HIGH (ref 1.7–7.7)
Neutrophils Relative %: 70 %
Platelets: 436 10*3/uL — ABNORMAL HIGH (ref 150–400)
RBC: 3.76 MIL/uL — ABNORMAL LOW (ref 4.22–5.81)
RDW: 11.9 % (ref 11.5–15.5)
WBC: 15.8 10*3/uL — ABNORMAL HIGH (ref 4.0–10.5)
nRBC: 0 % (ref 0.0–0.2)

## 2023-04-02 MED ORDER — HYDROMORPHONE HCL 1 MG/ML IJ SOLN: 0.5000 mg | INTRAMUSCULAR | Status: DC | PRN

## 2023-04-02 MED ORDER — ACETAMINOPHEN 500 MG PO TABS
1000.0000 mg | ORAL_TABLET | Freq: Four times a day (QID) | ORAL | Status: DC
  Administered 2023-04-02 (×3): 1000 mg via ORAL
  Filled 2023-04-02 (×5): qty 2

## 2023-04-02 MED ORDER — AMLODIPINE BESYLATE 5 MG PO TABS
5.0000 mg | ORAL_TABLET | Freq: Every day | ORAL | Status: DC
  Administered 2023-04-02 – 2023-04-03 (×2): 5 mg via ORAL
  Filled 2023-04-02 (×2): qty 1

## 2023-04-02 MED ORDER — OXYCODONE HCL 5 MG PO TABS
5.0000 mg | ORAL_TABLET | Freq: Four times a day (QID) | ORAL | Status: DC | PRN
  Administered 2023-04-02: 10 mg via ORAL
  Filled 2023-04-02: qty 2

## 2023-04-02 MED ORDER — METHOCARBAMOL 500 MG PO TABS: 500.0000 mg | ORAL_TABLET | Freq: Four times a day (QID) | ORAL | Status: DC | PRN

## 2023-04-02 NOTE — Plan of Care (Signed)
Problem: Clinical Measurements: Goal: Will remain free from infection Outcome: Progressing   Problem: Nutrition: Goal: Adequate nutrition will be maintained Outcome: Progressing

## 2023-04-02 NOTE — Progress Notes (Signed)
PROGRESS NOTE    Nicholas Moody  WGN:[id number]  DOB: 05/26/1956  DOA: 03/23/2023 PCP: Anne Ng, NP Outpatient Specialists:   Hospital course: 67 year old man with HTN who was admitted for acute appendicitis with 2X 1 cm periappendiceal abscess. Patient was seen by general surgery who were recommending conservative management with IV antibiotics. Due to worsening symptoms, with SBO, surgery recommended for lap appendectomy with drainage of pelvic abscess on 10/11.    Subjective: Frustrated about vegan and lactose intolerance food options here in the hospital, otherwise, no new complaints. Having BM, passing gas, tolerating diet.   Objective: Vitals:   04/01/23 2023 04/02/23 0508 04/02/23 0721 04/02/23 0855  BP: (!) 140/87 (!) 140/89 (!) 146/93 (!) 159/99  Pulse: 83 75 78 86  Resp: 20  16 20   Temp: 98.2 F (36.8 C) 98 F (36.7 C) 98.5 F (36.9 C) 98.8 F (37.1 C)  TempSrc: Oral  Oral   SpO2: 96% 98% 98% 99%  Weight:      Height:        Intake/Output Summary (Last 24 hours) at 04/02/2023 1137 Last data filed at 04/02/2023 0900 Gross per 24 hour  Intake 508.17 ml  Output 30 ml  Net 478.17 ml   Filed Weights   03/23/23 1827 03/24/23 0428 03/30/23 0939  Weight: 82.1 kg 79.4 kg 79.4 kg     Exam: General: NAD Cardiovascular: S1, S2 present Respiratory: CTAB Abdomen: Soft, tender, nondistended, +bowel sounds present, drain noted Musculoskeletal: No bilateral pedal edema noted Skin: Normal Psychiatry: Normal mood      Data Reviewed:  Basic Metabolic Panel: Recent Labs  Lab 03/28/23 0431 03/30/23 0502 03/31/23 0329 03/31/23 0332 04/01/23 0904 04/02/23 0606  NA 134* 134*  --  134* 134* 138  K 4.1 3.1*  --  3.4* 3.8 3.9  CL 97* 91*  --  97* 100 95*  CO2 27 25  --  24 24 24   GLUCOSE 103* 104*  --  92 85 89  BUN 12 21  --  26* 24* 19  CREATININE 1.12 1.34*  --  1.07 1.08 1.27*  CALCIUM 8.9 9.3  --  8.4* 8.1* 9.3  MG 2.0  --  2.1  --    --   --   PHOS 2.6  --   --   --   --   --     CBC: Recent Labs  Lab 03/29/23 0512 03/30/23 0502 03/31/23 0332 04/01/23 0904 04/02/23 0606  WBC 14.3* 17.5* 23.4* 16.5* 15.8*  NEUTROABS  --  13.4* 17.3* 12.3* 11.0*  HGB 13.2 14.5 13.3 12.6* 12.1*  HCT 36.7* 41.1 37.6* 34.9* 34.7*  MCV 92.2 89.5 90.4 94.1 92.3  PLT 382 476* 454* 429* 436*     Scheduled Meds:  acetaminophen  1,000 mg Oral Q6H   heparin  5,000 Units Subcutaneous Q8H   levothyroxine  125 mcg Oral Q0600   pantoprazole (PROTONIX) IV  40 mg Intravenous Daily   Or   pantoprazole  40 mg Oral Daily   Continuous Infusions:  piperacillin-tazobactam 3.375 g (04/02/23 0622)     Assessment & Plan:   Acute perforated appendicitis with localized abscess complicated by SBO S/p lap appendectomy with drainage of pelvic abscess on 10/11 Currently afebrile with resolving leukocytosis Continue IV Zosyn Gen surg on board, s/p lap appendectomy as noted above Advanced diet, encouraged oral fluid intake due to IVF shortage, stopped IVF Daily CBC, pain management  Mild AKI Fluctuating  Likely 2/2 above,  S/p IVF, encouraged oral fluid intake due to IVF shortage Hold PTA lisinopril Daily BMP  Hypokalemia Replaced prn  HTN BP uncontrolled Hold lisinopril due to mild AKI, start amlodipine for now Labetalol, hydralazine as needed  Hypothyroidism Continue Synthroid    DVT prophylaxis: Subcu heparin Code Status: Full Family Communication: none      Studies: No results found.  Principal Problem:   Acute appendicitis     Briant Cedar,    LOS: 9 days

## 2023-04-02 NOTE — Progress Notes (Signed)
3 Days Post-Op  Subjective: CC: Patient reports no abdominal pain at rest. He has some incisional pain when trying to have a bm. No prn pain medication over the last 24 hours. He is lactose intolerant and vegan so limited optioned on FLD. Tolerated clears and a protein shake yesterday without n/v. Passing flatus. BM yesterday + today. Voiding.   Afebrile. No tachycardia or hypotension. WBC 23.4 > 16.5 > 15.8  Objective: Vital signs in last 24 hours: Temp:  [98 F (36.7 C)-98.5 F (36.9 C)] 98.5 F (36.9 C) (10/14 0721) Pulse Rate:  [75-89] 78 (10/14 0721) Resp:  [16-21] 16 (10/14 0721) BP: (130-146)/(76-93) 146/93 (10/14 0721) SpO2:  [96 %-99 %] 98 % (10/14 0721) Last BM Date : 04/01/23  Intake/Output from previous day: 10/13 0701 - 10/14 0700 In: 363.1 [P.O.:180; IV Piggyback:183.1] Out: 31 [Drains:30; Stool:1] Intake/Output this shift: No intake/output data recorded.  PE: Gen:  Alert, NAD, pleasant Card:  Reg Pulm: Rate and effort normal Abd: Soft, mild distension, appropriately tender around laparoscopic incisions, no rigidity or guarding and otherwise NT, +BS. Incisions with glue intact appears well and are without drainage, bleeding, or signs of infection. Drain stripped - blood/ss drainage - 30cc/24 hours.  Psych: A&Ox3   Lab Results:  Recent Labs    04/01/23 0904 04/02/23 0606  WBC 16.5* 15.8*  HGB 12.6* 12.1*  HCT 34.9* 34.7*  PLT 429* 436*   BMET Recent Labs    04/01/23 0904 04/02/23 0606  NA 134* 138  K 3.8 3.9  CL 100 95*  CO2 24 24  GLUCOSE 85 89  BUN 24* 19  CREATININE 1.08 1.27*  CALCIUM 8.1* 9.3   PT/INR No results for input(s): "LABPROT", "INR" in the last 72 hours. CMP     Component Value Date/Time   NA 138 04/02/2023 0606   K 3.9 04/02/2023 0606   CL 95 (L) 04/02/2023 0606   CO2 24 04/02/2023 0606   GLUCOSE 89 04/02/2023 0606   BUN 19 04/02/2023 0606   CREATININE 1.27 (H) 04/02/2023 0606   CALCIUM 9.3 04/02/2023 0606   PROT  6.0 (L) 03/28/2023 0431   ALBUMIN 2.8 (L) 03/28/2023 0431   AST 30 03/28/2023 0431   ALT 24 03/28/2023 0431   ALKPHOS 52 03/28/2023 0431   BILITOT 0.8 03/28/2023 0431   GFRNONAA >60 04/02/2023 0606   Lipase     Component Value Date/Time   LIPASE 20 03/23/2023 1835    Studies/Results: No results found.  Anti-infectives: Anti-infectives (From admission, onward)    Start     Dose/Rate Route Frequency Ordered Stop   03/24/23 0645  piperacillin-tazobactam (ZOSYN) IVPB 3.375 g        3.375 g 12.5 mL/hr over 240 Minutes Intravenous Every 8 hours 03/24/23 0547     03/23/23 2245  piperacillin-tazobactam (ZOSYN) IVPB 3.375 g        3.375 g 100 mL/hr over 30 Minutes Intravenous  Once 03/23/23 2235 03/23/23 2323        Assessment/Plan POD 3 s/p laparoscopic appendectomy with drainage of pelvic abscess by Dr. Luisa Hart on 10/11 for Perforated appendicitis with pelvic abscess of small bowel obstruction  - Adv to soft diet  - Cont abx, plan 5d post op - Cont JP drain - Path pending - Mobilize - Pulm toilet  FEN - Soft, IVF per TRH (Cr 1.08 > 1.27 today, voiding)  VTE - SCDs, subcutaneous heparin ID - Zosyn   LOS: 9 days    Casimiro Needle  Kirstie Mirza , [facility] 04/02/2023, 8:37 AM Please see Amion for pager number during day hours 7:00am-4:30pm

## 2023-04-03 DIAGNOSIS — K3589 Other acute appendicitis without perforation or gangrene: Secondary | ICD-10-CM | POA: Diagnosis not present

## 2023-04-03 LAB — CBC WITH DIFFERENTIAL/PLATELET
Abs Immature Granulocytes: 0.11 10*3/uL — ABNORMAL HIGH (ref 0.00–0.07)
Basophils Absolute: 0.1 10*3/uL (ref 0.0–0.1)
Basophils Relative: 1 %
Eosinophils Absolute: 0.9 10*3/uL — ABNORMAL HIGH (ref 0.0–0.5)
Eosinophils Relative: 7 %
HCT: 34.8 % — ABNORMAL LOW (ref 39.0–52.0)
Hemoglobin: 12.3 g/dL — ABNORMAL LOW (ref 13.0–17.0)
Immature Granulocytes: 1 %
Lymphocytes Relative: 16 %
Lymphs Abs: 1.9 10*3/uL (ref 0.7–4.0)
MCH: 32.3 pg (ref 26.0–34.0)
MCHC: 35.3 g/dL (ref 30.0–36.0)
MCV: 91.3 fL (ref 80.0–100.0)
Monocytes Absolute: 1.1 10*3/uL — ABNORMAL HIGH (ref 0.1–1.0)
Monocytes Relative: 9 %
Neutro Abs: 8.1 10*3/uL — ABNORMAL HIGH (ref 1.7–7.7)
Neutrophils Relative %: 66 %
Platelets: 437 10*3/uL — ABNORMAL HIGH (ref 150–400)
RBC: 3.81 MIL/uL — ABNORMAL LOW (ref 4.22–5.81)
RDW: 11.9 % (ref 11.5–15.5)
WBC: 12.1 10*3/uL — ABNORMAL HIGH (ref 4.0–10.5)
nRBC: 0 % (ref 0.0–0.2)

## 2023-04-03 LAB — BASIC METABOLIC PANEL
Anion gap: 12 (ref 5–15)
BUN: 13 mg/dL (ref 8–23)
CO2: 27 mmol/L (ref 22–32)
Calcium: 8.8 mg/dL — ABNORMAL LOW (ref 8.9–10.3)
Chloride: 97 mmol/L — ABNORMAL LOW (ref 98–111)
Creatinine, Ser: 1.07 mg/dL (ref 0.61–1.24)
GFR, Estimated: >60 mL/min (ref >60)
Glucose, Bld: 92 mg/dL (ref 70–99)
Potassium: 3.6 mmol/L (ref 3.5–5.1)
Sodium: 136 mmol/L (ref 135–145)

## 2023-04-03 LAB — SURGICAL PATHOLOGY

## 2023-04-03 MED ORDER — METHOCARBAMOL 500 MG PO TABS: 500.0000 mg | ORAL_TABLET | Freq: Four times a day (QID) | ORAL | 0 refills | Status: DC | PRN

## 2023-04-03 MED ORDER — ACETAMINOPHEN 500 MG PO TABS: 1000.0000 mg | ORAL_TABLET | Freq: Four times a day (QID) | ORAL | Status: AC | PRN

## 2023-04-03 MED ORDER — OXYCODONE HCL 5 MG PO TABS: 5.0000 mg | ORAL_TABLET | Freq: Four times a day (QID) | ORAL | 0 refills | Status: DC | PRN

## 2023-04-03 MED ORDER — AMOXICILLIN-POT CLAVULANATE 875-125 MG PO TABS: 1.0000 | ORAL_TABLET | Freq: Two times a day (BID) | ORAL | 0 refills | Status: AC

## 2023-04-03 NOTE — Discharge Summary (Signed)
Physician Discharge Summary   Patient: Nicholas Moody MRN: [id number] DOB: 05/03/1956  Admit date:     03/23/2023  Discharge date: 04/03/23  Discharge Physician: Briant Cedar   PCP: Anne Ng, NP   Recommendations at discharge:   Follow-up with PCP in 1 week Follow-up with general surgery as scheduled  Discharge Diagnoses: Principal Problem:   Acute appendicitis    Hospital Course: 67 year old man with HTN who was admitted for acute appendicitis with 2X 1 cm periappendiceal abscess. Patient was seen by general surgery who were recommending conservative management with IV antibiotics. Due to worsening symptoms, with SBO, surgery recommended for lap appendectomy with drainage of pelvic abscess on 10/11.    Today, patient denies any new complaints.  Remained stable, tolerating diet, having BMs.  General surgery okay to discharge from their standpoint.  Follow-up with PCP as well as general surgery as scheduled.    Assessment and Plan:  Acute perforated appendicitis with localized abscess complicated by SBO S/p lap appendectomy with drainage of pelvic abscess on 10/11 Currently afebrile with resolving leukocytosis S/p IV Zosyn--> complete with 2 days of PO augmentin Gen surg on board, s/p lap appendectomy as noted above, JP drain discontinued Follow up with general surgery   Mild AKI Resolved Likely 2/2 above  S/p IVF Resume lisinopril   Hypokalemia Replaced prn   HTN BP stable Resume lisinopril   Hypothyroidism Continue Synthroid    Consultants: General surgery Procedures performed: As noted above Disposition: Home Diet recommendation: Continue Vegan diet with protein supplement      DISCHARGE MEDICATION: Allergies as of 04/03/2023       Reactions   Lactose Intolerance (gi) Diarrhea, Nausea Only        Medication List     TAKE these medications    acetaminophen 500 MG tablet Commonly known as: TYLENOL Take 2 tablets  (1,000 mg total) by mouth every 6 (six) hours as needed.   amoxicillin-clavulanate 875-125 MG tablet Commonly known as: AUGMENTIN Take 1 tablet by mouth 2 (two) times daily for 3 doses.   aspirin 325 MG tablet Take 325 mg by mouth every 6 (six) hours as needed for moderate pain.   levothyroxine 125 MCG tablet Commonly known as: SYNTHROID TAKE ONE TABLET BY MOUTH ONE TIME DAILY   lisinopril 20 MG tablet Commonly known as: ZESTRIL TAKE ONE TABLET BY MOUTH ONE TIME DAILY   methocarbamol 500 MG tablet Commonly known as: ROBAXIN Take 1 tablet (500 mg total) by mouth every 6 (six) hours as needed for muscle spasms.   oxyCODONE 5 MG immediate release tablet Commonly known as: Oxy IR/ROXICODONE Take 1 tablet (5 mg total) by mouth every 6 (six) hours as needed (pain).   sildenafil 100 MG tablet Commonly known as: VIAGRA TAKE ONE TABLET BY MOUTH ONE TIME DAILY AS NEEDED        Follow-up Information     Cornett, Thomas, MD Follow up in 3 week(s).   Specialty: General Surgery Why: Arrive 30 minutes prior to your appointment time, Please bring your insurance card and photo ID Contact information: [address] [place] [address] [phone]         Nche, Bonna Gains, NP. Schedule an appointment as soon as possible for a visit in 1 week(s).   Specialty: Internal Medicine Contact information: [address] [place] [address] [phone]                Discharge Exam: Ceasar Mons Weights  03/23/23 1827 03/24/23 0428 03/30/23 0939  Weight: 82.1 kg 79.4 kg 79.4 kg   General: NAD  Cardiovascular: S1, S2 present Respiratory: CTAB Abdomen: Soft, mildly tender, around lap sites, nondistended, bowel sounds present Musculoskeletal: No bilateral pedal edema noted Skin: Normal Psychiatry: Normal mood   Condition at discharge: stable  The results of significant diagnostics from this hospitalization (including imaging, microbiology, ancillary  and laboratory) are listed below for reference.   Imaging Studies: DG Abd Portable 1V-Small Bowel Obstruction Protocol-initial, 8 hr delay  Result Date: 03/30/2023 CLINICAL DATA:  Encounter for small bowel obstruction. EXAM: PORTABLE ABDOMEN - 1 VIEW COMPARISON:  03/29/2023 FINDINGS: Nasogastric tube tip is in the left upper abdomen and gastric fundus region. Again noted are dilated loops of small bowel in the abdomen and the bowel gas distension has not significantly changed from the prior examination. Again noted is oral contrast in the right colon. Again noted are multiple pelvic calcifications. 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 Limited evaluation for free air on the supine images. IMPRESSION: 1. Nasogastric tube tip is in the left upper abdomen and gastric fundus region. 2. Persistent dilated loops of small bowel in the abdomen. Findings are compatible with a small bowel obstruction. Small bowel distension has not significantly changed since 03/29/2023. Electronically Signed   By: Richarda Overlie M.D.   On: 03/30/2023 09:21   DG Abd Portable 1V-Small Bowel Protocol-Position Verification  Result Date: 03/29/2023 CLINICAL DATA:  Nasogastric tube placement. EXAM: PORTABLE ABDOMEN - 1 VIEW COMPARISON:  Abdominal radiograph earlier today. FINDINGS: Tip and side port of the enteric tube below the diaphragm in the stomach. Similar gaseous bowel distension centrally. IMPRESSION: Tip and side port of the enteric tube below the diaphragm in the stomach. Electronically Signed   By: Narda Rutherford M.D.   On: 03/29/2023 17:27   DG Abd Portable 1V  Result Date: 03/29/2023 CLINICAL DATA:  Ileus. EXAM: PORTABLE ABDOMEN - 1 VIEW COMPARISON:  March 12, 2013. FINDINGS: Dilated small bowel loops are noted concerning for ileus or distal small bowel obstruction. Contrast is noted in nondilated colon. Phleboliths are noted in the pelvis. IMPRESSION: Small bowel dilatation is noted concerning  for ileus or distal small bowel obstruction. Electronically Signed   By: Lupita Raider M.D.   On: 03/29/2023 12:25   CT ABDOMEN PELVIS W CONTRAST  Result Date: 03/26/2023 CLINICAL DATA:  Known appendiceal abscess, increasing abdominal pain EXAM: CT ABDOMEN AND PELVIS WITH CONTRAST TECHNIQUE: Multidetector CT imaging of the abdomen and pelvis was performed using the standard protocol following bolus administration of intravenous contrast. RADIATION DOSE REDUCTION: This exam was performed according to the departmental dose-optimization program which includes automated exposure control, adjustment of the mA and/or kV according to patient size and/or use of iterative reconstruction technique. CONTRAST:  75mL OMNIPAQUE IOHEXOL 350 MG/ML SOLN COMPARISON:  03/23/2023 FINDINGS: Lower chest: No acute pleural or parenchymal lung disease. Hepatobiliary: Stable hepatic cysts. High attenuation material within the gallbladder may reflect vicarious excretion of previously administered contrast versus gallbladder sludge. No evidence of calcified gallstones or acute cholecystitis. No biliary duct dilation. Pancreas: Unremarkable. No pancreatic ductal dilatation or surrounding inflammatory changes. Spleen: Normal in size without focal abnormality. Adrenals/Urinary Tract: Stable nonobstructing right renal calculi. Stable bilateral simple appearing renal cortical cysts which do not require specific imaging follow-up. No evidence of hydronephrosis within either kidney. The adrenals are unremarkable. The bladder is minimally distended, limiting its evaluation. Stomach/Bowel: Continued findings of appendicitis, with a dilated inflamed  appendix in the right lower quadrant. The small periappendiceal abscess seen previously is again identified, measuring up to 1 cm reference image 58/3. Increased mesenteric edema within the lower abdomen and pelvis, with interval development of small volume ascites. There has been interval development  of a high-grade small bowel obstruction, transition point within the distal jejunum/proximal ileum right lower quadrant, reference axial images 59-64 of series 3. Maximal diameter of the jejunum measures 4.2 cm. Stable diverticulosis of the distal colon without evidence of acute diverticulitis. Vascular/Lymphatic: Aortic atherosclerosis. No enlarged abdominal or pelvic lymph nodes. Reproductive: Prostate is stable. Other: Small volume ascites within the lower abdomen and pelvis as above. No free intraperitoneal gas. No abdominal wall hernia. Musculoskeletal: No acute or destructive bony abnormalities. Reconstructed images demonstrate no additional findings. IMPRESSION: 1. Persistent findings of acute appendicitis, with small periappendiceal abscess again identified and not appreciably changed in size. 2. Interval development of high-grade small bowel obstruction, transition within the distal jejunum/proximal ileum. 3. Increasing mesenteric edema and small volume ascites within the lower abdomen and pelvis. 4. High density material within the gallbladder consistent with vicarious excretion of contrast versus gallbladder sludge. No evidence of acute cholecystitis. 5. Stable nonobstructing right renal calculi. 6. Stable diverticulosis without diverticulitis. These results will be called to the ordering clinician or representative by the Radiologist Assistant, and communication documented in the PACS or [employer]. Electronically Signed   By: Sharlet Salina M.D.   On: 03/26/2023 19:02   CT ABDOMEN PELVIS W CONTRAST  Result Date: 03/23/2023 CLINICAL DATA:  Right lower quadrant abdominal pain. EXAM: CT ABDOMEN AND PELVIS WITH CONTRAST TECHNIQUE: Multidetector CT imaging of the abdomen and pelvis was performed using the standard protocol following bolus administration of intravenous contrast. RADIATION DOSE REDUCTION: This exam was performed according to the departmental dose-optimization program which includes  automated exposure control, adjustment of the mA and/or kV according to patient size and/or use of iterative reconstruction technique. CONTRAST:  75mL OMNIPAQUE IOHEXOL 350 MG/ML SOLN COMPARISON:  CT 10/24/2009 FINDINGS: Lower chest: Subsegmental atelectasis or scarring in the left lower lobe. No pleural effusion. Hepatobiliary: 3.2 cm cyst in the right lobe of the liver. There additional scattered small subcentimeter hypodensities are too small to characterize. No suspicious liver lesion. Gallbladder physiologically distended, no calcified stone. No biliary dilatation. Pancreas: No ductal dilatation or inflammation. Spleen: Normal in size without focal abnormality. Adrenals/Urinary Tract: No suspicious adrenal nodule. No hydronephrosis or perinephric edema. Homogeneous renal enhancement with symmetric excretion on delayed phase imaging. Nonobstructing stones in the lower pole of the right kidney, largest 8 mm. There are bilateral renal cysts. No further follow-up imaging is recommended. Urinary bladder is physiologically distended. Mild wall thickening about the right aspect of the bladder likely reactive. Stomach/Bowel: Inflammatory process in the right lower quadrant. Inflamed tubular structure measuring 8 mm, coronal series 6, image 48 and series 3, image 61, likely represents an inflamed appendix. Just distal to this is a small peripherally enhancing fluid collection measuring 2 x 0.9 x 0.9 cm series 6, image 47 and series 3, image 56, suspicious for small periappendiceal abscess. There is marked fat stranding in the right lower quadrant as well as wall thickening of the cecum. Small amounts of right lower quadrant non organized free fluid. Mild wall thickening about the terminal ileum also seen. Distal ileal small bowel is fluid-filled and mildly prominent, likely reactive. No obstruction. Small volume of colonic stool. Left colonic diverticulosis. No focal diverticulitis. No free air. Vascular/Lymphatic:  Aortic atherosclerosis. No  aneurysm. The portal vein is patent. Small ileocolic nodes typically reactive. Reproductive: Prominent prostate spans 5.8 cm transverse. Other: Inflammatory process in the right lower quadrant with fat stranding and small amount of non organized free fluid. Small fluid collection as described. There is no free air. No abdominal wall hernia. Musculoskeletal: Lower lumbar facet hypertrophy. There are no acute or suspicious osseous abnormalities. No intramuscular collection. IMPRESSION: 1. Findings consistent with acute appendicitis with small periappendiceal abscess measuring 2 x 0.9 x 0.9 cm. There is marked inflammation in the right lower quadrant. Wall thickening of the cecum and distal ileum likely reactive. 2. Nonobstructing right nephrolithiasis. 3. Colonic diverticulosis without focal diverticulitis. 4. Enlarged prostate. Aortic Atherosclerosis (ICD10-I70.0). Electronically Signed   By: Narda Rutherford M.D.   On: 03/23/2023 22:13    Microbiology: Results for orders placed or performed during the hospital encounter of 03/23/23  Culture, blood (x 2)     Status: None   Collection Time: 03/24/23  6:46 AM   Specimen: BLOOD  Result Value Ref Range Status   Specimen Description BLOOD SITE NOT SPECIFIED  Final   Special Requests   Final    BOTTLES DRAWN AEROBIC AND ANAEROBIC Blood Culture adequate volume   Culture   Final    NO GROWTH 5 DAYS Performed at [facility], [address] [place], [place] [address]    Report Status 03/29/2023 FINAL  Final  Culture, blood (x 2)     Status: None   Collection Time: 03/24/23  6:53 AM   Specimen: BLOOD RIGHT HAND  Result Value Ref Range Status   Specimen Description BLOOD RIGHT HAND  Final   Special Requests   Final    BOTTLES DRAWN AEROBIC AND ANAEROBIC Blood Culture adequate volume   Culture   Final    NO GROWTH 5 DAYS Performed at [facility], [address] [place], [place] [address]    Report Status  03/29/2023 FINAL  Final    Labs: CBC: Recent Labs  Lab 03/30/23 0502 03/31/23 0332 04/01/23 0904 04/02/23 0606 04/03/23 0404  WBC 17.5* 23.4* 16.5* 15.8* 12.1*  NEUTROABS 13.4* 17.3* 12.3* 11.0* 8.1*  HGB 14.5 13.3 12.6* 12.1* 12.3*  HCT 41.1 37.6* 34.9* 34.7* 34.8*  MCV 89.5 90.4 94.1 92.3 91.3  PLT 476* 454* 429* 436* 437*   Basic Metabolic Panel: Recent Labs  Lab 03/28/23 0431 03/30/23 0502 03/31/23 0329 03/31/23 0332 04/01/23 0904 04/02/23 0606 04/03/23 0404  NA 134* 134*  --  134* 134* 138 136  K 4.1 3.1*  --  3.4* 3.8 3.9 3.6  CL 97* 91*  --  97* 100 95* 97*  CO2 27 25  --  24 24 24 27   GLUCOSE 103* 104*  --  92 85 89 92  BUN 12 21  --  26* 24* 19 13  CREATININE 1.12 1.34*  --  1.07 1.08 1.27* 1.07  CALCIUM 8.9 9.3  --  8.4* 8.1* 9.3 8.8*  MG 2.0  --  2.1  --   --   --   --   PHOS 2.6  --   --   --   --   --   --    Liver Function Tests: Recent Labs  Lab 03/28/23 0431  AST 30  ALT 24  ALKPHOS 52  BILITOT 0.8  PROT 6.0*  ALBUMIN 2.8*   CBG: Recent Labs  Lab 04/01/23 1619  GLUCAP 87    Discharge time spent: greater than 30  minutes.  Signed: Briant Cedar, MD [facility] 04/03/2023

## 2023-04-03 NOTE — Discharge Instructions (Signed)

## 2023-04-03 NOTE — Progress Notes (Signed)
DISCHARGE NOTE HOME ILYA NEELY to be discharged Home per MD order. Discussed prescriptions and follow up appointments with the patient. Prescriptions given to patient; medication list explained in detail. Patient verbalized understanding.  Skin clean, dry and intact without evidence of skin break down, no evidence of skin tears noted. IV catheter discontinued intact. Site without signs and symptoms of complications. Dressing and pressure applied. Pt denies pain at the site currently. No complaints noted.  Patient free of lines, drains, and wounds.   An After Visit Summary (AVS) was printed and given to the patient. Patient escorted via wheelchair, and discharged home via private auto. Pick by wife Orson Gear, RN

## 2023-04-03 NOTE — Progress Notes (Signed)
4 Days Post-Op  Subjective: Looks well today.  Tolerating soft diet with no issues.  Moving his bowels and having flatus.  Pain seems well controlled on oral pain meds  Objective: Vital signs in last 24 hours: Temp:  [97.6 F (36.4 C)-98.6 F (37 C)] 98.6 F (37 C) (10/15 0737) Pulse Rate:  [69-98] 84 (10/15 0737) Resp:  [16] 16 (10/15 0737) BP: (140-144)/(91-101) 144/91 (10/15 0737) SpO2:  [97 %-100 %] 98 % (10/15 0737) Last BM Date : 04/02/23  Intake/Output from previous day: 10/14 0701 - 10/15 0700 In: 692.4 [P.O.:600; IV Piggyback:92.4] Out: -  Intake/Output this shift: No intake/output data recorded.  PE: Gen:  Alert, NAD, pleasant Abd: Soft, ND, appropriately tender around laparoscopic incisions, JP with serosang output, 5cc in last 24 hrs. Psych: A&Ox3   Lab Results:  Recent Labs    04/02/23 0606 04/03/23 0404  WBC 15.8* 12.1*  HGB 12.1* 12.3*  HCT 34.7* 34.8*  PLT 436* 437*   BMET Recent Labs    04/02/23 0606 04/03/23 0404  NA 138 136  K 3.9 3.6  CL 95* 97*  CO2 24 27  GLUCOSE 89 92  BUN 19 13  CREATININE 1.27* 1.07  CALCIUM 9.3 8.8*   PT/INR No results for input(s): "LABPROT", "INR" in the last 72 hours. CMP     Component Value Date/Time   NA 136 04/03/2023 0404   K 3.6 04/03/2023 0404   CL 97 (L) 04/03/2023 0404   CO2 27 04/03/2023 0404   GLUCOSE 92 04/03/2023 0404   BUN 13 04/03/2023 0404   CREATININE 1.07 04/03/2023 0404   CALCIUM 8.8 (L) 04/03/2023 0404   PROT 6.0 (L) 03/28/2023 0431   ALBUMIN 2.8 (L) 03/28/2023 0431   AST 30 03/28/2023 0431   ALT 24 03/28/2023 0431   ALKPHOS 52 03/28/2023 0431   BILITOT 0.8 03/28/2023 0431   GFRNONAA >60 04/03/2023 0404   Lipase     Component Value Date/Time   LIPASE 20 03/23/2023 1835    Studies/Results: No results found.  Anti-infectives: Anti-infectives (From admission, onward)    Start     Dose/Rate Route Frequency Ordered Stop   04/03/23 0000  amoxicillin-clavulanate  (AUGMENTIN) 875-125 MG tablet        1 tablet Oral 2 times daily 04/03/23 1116 04/05/23 2359   03/24/23 0645  piperacillin-tazobactam (ZOSYN) IVPB 3.375 g        3.375 g 12.5 mL/hr over 240 Minutes Intravenous Every 8 hours 03/24/23 0547     03/23/23 2245  piperacillin-tazobactam (ZOSYN) IVPB 3.375 g        3.375 g 100 mL/hr over 30 Minutes Intravenous  Once 03/23/23 2235 03/23/23 2323        Assessment/Plan POD 4, s/p laparoscopic appendectomy with drainage of pelvic abscess by Dr. Luisa Hart on 10/11 for Perforated appendicitis with pelvic abscess of small bowel obstruction  - tol soft diet  - Cont abx, plan 5d post op - DC JP drain today prior to DC home. - Path pending - Mobilize - Pulm toilet - WBC down to 12K - surgically stable for DC home.  Discussed with primary as well who feels he is medically stable -follow up with Dr. Luisa Hart being arranged in our office.  FEN - Soft  VTE - SCDs, subcutaneous heparin ID - Zosyn, will give 1.5 more days of Augmentin at home.   LOS: 10 days    Letha Cape , [facility] 04/03/2023, 11:17 AM Please  see Amion for pager number during day hours 7:00am-4:30pm

## 2023-04-04 ENCOUNTER — Telehealth: Payer: Self-pay

## 2023-04-04 NOTE — Transitions of Care (Post Inpatient/ED Visit) (Signed)
   04/04/2023  Name: Nicholas Moody MRN: [id number] DOB: [birth year]  Today's TOC FU Call Status: Today's TOC FU Call Status:: Successful TOC FU Call Completed TOC FU Call Complete Date: 04/04/23 Patient's Name and Date of Birth confirmed.  Transition Care Management Follow-up Telephone Call Date of Discharge: 04/03/23 Discharge Facility: [facility]) Type of Discharge: Inpatient Admission Primary Inpatient Discharge Diagnosis:: appendicitis How have you been since you were released from the hospital?: Better Any questions or concerns?: No  Items Reviewed: Did you receive and understand the discharge instructions provided?: Yes Medications obtained,verified, and reconciled?: Yes (Medications Reviewed) Any new allergies since your discharge?: No Dietary orders reviewed?: Yes Do you have support at home?: Yes People in Home: spouse  Medications Reviewed Today: Medications Reviewed Today     Reviewed by Karena Addison, LPN (Licensed Practical Nurse) on 04/04/23 at 0912  Med List Status: <None>   Medication Order Taking? Sig Documenting Provider Last Dose Status Informant  acetaminophen (TYLENOL) 500 MG tablet [id number]  Take 2 tablets (1,000 mg total) by mouth every 6 (six) hours as needed. Barnetta Chapel, PA-C  Active   amoxicillin-clavulanate (AUGMENTIN) 875-125 MG tablet [id number]  Take 1 tablet by mouth 2 (two) times daily for 3 doses. Barnetta Chapel, PA-C  Active   aspirin 325 MG tablet [id number] No Take 325 mg by mouth every 6 (six) hours as needed for moderate pain. [provider] Lajoyce Lauber Self, Pharmacy Records  levothyroxine (SYNTHROID) 125 MCG tablet [id number] No TAKE ONE TABLET BY MOUTH ONE TIME DAILY Nche, Bonna Gains, NP Past Week Active Self, Pharmacy Records  lisinopril (ZESTRIL) 20 MG tablet [id number] No TAKE ONE TABLET BY MOUTH ONE TIME DAILY Nche, Bonna Gains, NP Past Week Active Self, Pharmacy Records  methocarbamol (ROBAXIN) 500 MG tablet  [id number]  Take 1 tablet (500 mg total) by mouth every 6 (six) hours as needed for muscle spasms. Barnetta Chapel, PA-C  Active   oxyCODONE (OXY IR/ROXICODONE) 5 MG immediate release tablet [id number]  Take 1 tablet (5 mg total) by mouth every 6 (six) hours as needed (pain). Barnetta Chapel, PA-C  Active   sildenafil (VIAGRA) 100 MG tablet [id number] No TAKE ONE TABLET BY MOUTH ONE TIME DAILY AS NEEDED Nche, Bonna Gains, NP [place]k Active Self, Pharmacy Records            Home Care and Equipment/Supplies: Were Home Health Services Ordered?: NA Any new equipment or medical supplies ordered?: NA  Functional Questionnaire: Do you need assistance with bathing/showering or dressing?: No Do you need assistance with meal preparation?: No Do you need assistance with eating?: No Do you have difficulty maintaining continence: No Do you need assistance with getting out of bed/getting out of a chair/moving?: No Do you have difficulty managing or taking your medications?: No  Follow up appointments reviewed: PCP Follow-up appointment confirmed?: Yes Date of PCP follow-up appointment?: 04/10/23 Follow-up Provider: [facility] Follow-up appointment confirmed?: No Reason Specialist Follow-Up Not Confirmed: Patient has Specialist Provider Number and will Call for Appointment Do you need transportation to your follow-up appointment?: No Do you understand care options if your condition(s) worsen?: Yes-patient verbalized understanding    SIGNATURE Karena Addison, LPN [facility] Nurse Health Advisor Direct Dial [phone]

## 2023-04-10 ENCOUNTER — Encounter: Payer: Self-pay | Admitting: Nurse Practitioner

## 2023-04-10 ENCOUNTER — Ambulatory Visit: Payer: Medicare Other | Admitting: Nurse Practitioner

## 2023-04-10 VITALS — BP 139/86 | HR 69 | Temp 97.6°F | Resp 18 | Wt 169.2 lb

## 2023-04-10 DIAGNOSIS — Z9049 Acquired absence of other specified parts of digestive tract: Secondary | ICD-10-CM

## 2023-04-10 DIAGNOSIS — Z09 Encounter for follow-up examination after completed treatment for conditions other than malignant neoplasm: Secondary | ICD-10-CM | POA: Insufficient documentation

## 2023-04-10 LAB — COMPREHENSIVE METABOLIC PANEL
ALT: 27 U/L (ref 0–53)
AST: 19 U/L (ref 0–37)
Albumin: 4.4 g/dL (ref 3.5–5.2)
Alkaline Phosphatase: 81 U/L (ref 39–117)
BUN: 10 mg/dL (ref 6–23)
CO2: 27 meq/L (ref 19–32)
Calcium: 10 mg/dL (ref 8.4–10.5)
Chloride: 99 meq/L (ref 96–112)
Creatinine, Ser: 0.93 mg/dL (ref 0.40–1.50)
GFR: 84.18 mL/min (ref >60.00)
Glucose, Bld: 79 mg/dL (ref 70–99)
Potassium: 4.8 meq/L (ref 3.5–5.1)
Sodium: 138 meq/L (ref 135–145)
Total Bilirubin: 0.4 mg/dL (ref 0.2–1.2)
Total Protein: 7.3 g/dL (ref 6.0–8.3)

## 2023-04-10 LAB — CBC WITH DIFFERENTIAL/PLATELET
Basophils Absolute: 0.2 10*3/uL — ABNORMAL HIGH (ref 0.0–0.1)
Basophils Relative: 1.4 % (ref 0.0–3.0)
Eosinophils Absolute: 0.6 10*3/uL (ref 0.0–0.7)
Eosinophils Relative: 5 % (ref 0.0–5.0)
HCT: 40.3 % (ref 39.0–52.0)
Hemoglobin: 13.3 g/dL (ref 13.0–17.0)
Lymphocytes Relative: 26.6 % (ref 12.0–46.0)
Lymphs Abs: 3.1 10*3/uL (ref 0.7–4.0)
MCHC: 33.1 g/dL (ref 30.0–36.0)
MCV: 96.3 fL (ref 78.0–100.0)
Monocytes Absolute: 1 10*3/uL (ref 0.1–1.0)
Monocytes Relative: 8.5 % (ref 3.0–12.0)
Neutro Abs: 6.8 10*3/uL (ref 1.4–7.7)
Neutrophils Relative %: 58.5 % (ref 43.0–77.0)
Platelets: 585 10*3/uL — ABNORMAL HIGH (ref 150.0–400.0)
RBC: 4.18 Mil/uL — ABNORMAL LOW (ref 4.22–5.81)
RDW: 13.2 % (ref 11.5–15.5)
WBC: 11.6 10*3/uL — ABNORMAL HIGH (ref 4.0–10.5)

## 2023-04-10 NOTE — Patient Instructions (Signed)
Go to lab Maintain adequate oral hydration and small frequent meals Maintain upcoming appointment with surgeon.

## 2023-04-10 NOTE — Assessment & Plan Note (Addendum)
TCM call 04/04/2023 Hospitalization 03/23/23 to 04/03/23: hospitalization was complicated by pelvic abscess and SBO. F/up appointment with surgeon 04/21/2023 Today denies any fever or ABDOMEN pain or nausea. He has completed oral abx prescribed. He reports persistent fatigue, adequate appetite, and last BM yesterday (no constipation or diarrhea).  Reviewed discharge summary, lab results and reconciled med list today Repeat CBC and CMP

## 2023-04-10 NOTE — Progress Notes (Unsigned)
Established Patient Visit  Patient: Nicholas Moody   DOB: [birth year]   67 y.o. Male  MRN: [id number] Visit Date: 04/10/2023  Subjective:    Chief Complaint  Patient presents with   Hospitalization Follow-up    PT is here for hospital follow up for acute appendicitis. Patient is having mild weakness    HPI   S/P appendectomy, follow-up exam TCM call 04/04/2023 Hospitalization 03/23/23 to 04/03/23: hospitalization was complicated by pelvic abscess and SBO. F/up appointment with surgeon 04/21/2023 Today denies any fever or ABDOMEN pain or nausea. He has completed oral abx prescribed. He reports persistent fatigue, adequate appetite, and last BM yesterday (no constipation or diarrhea).  Reviewed discharge summary, lab results and reconciled med list today Repeat CBC and CMP  Wt Readings from Last 3 Encounters:  04/10/23 169 lb 3.2 oz (76.7 kg)  03/30/23 175 lb 0.7 oz (79.4 kg)  03/08/23 181 lb (82.1 kg)    Reviewed medical, surgical, and social history today  Medications: Outpatient Medications Prior to Visit  Medication Sig   levothyroxine (SYNTHROID) 125 MCG tablet TAKE ONE TABLET BY MOUTH ONE TIME DAILY   lisinopril (ZESTRIL) 20 MG tablet TAKE ONE TABLET BY MOUTH ONE TIME DAILY   methocarbamol (ROBAXIN) 500 MG tablet Take 1 tablet (500 mg total) by mouth every 6 (six) hours as needed for muscle spasms.   oxyCODONE (OXY IR/ROXICODONE) 5 MG immediate release tablet Take 1 tablet (5 mg total) by mouth every 6 (six) hours as needed (pain).   sildenafil (VIAGRA) 100 MG tablet TAKE ONE TABLET BY MOUTH ONE TIME DAILY AS NEEDED   acetaminophen (TYLENOL) 500 MG tablet Take 2 tablets (1,000 mg total) by mouth every 6 (six) hours as needed. (Patient not taking: Reported on 04/10/2023)   aspirin 325 MG tablet Take 325 mg by mouth every 6 (six) hours as needed for moderate pain. (Patient not taking: Reported on 04/10/2023)   No facility-administered medications prior to  visit.   Reviewed past medical and social history.   ROS per HPI above  {Show previous labs (optional):23779}    Objective:  BP 139/86 (BP Location: Right Arm, Patient Position: Sitting, Cuff Size: Normal)   Pulse 69   Temp 97.6 F (36.4 C) (Temporal)   Resp 18   Wt 169 lb 3.2 oz (76.7 kg)   SpO2 99%   BMI 26.50 kg/m      Physical Exam Vitals and nursing note reviewed.  Constitutional:      General: He is not in acute distress. Cardiovascular:     Rate and Rhythm: Normal rate and regular rhythm.     Pulses: Normal pulses.     Heart sounds: Normal heart sounds.  Pulmonary:     Effort: Pulmonary effort is normal.     Breath sounds: Normal breath sounds.  Abdominal:     General: Bowel sounds are normal. There is no distension.     Palpations: Abdomen is soft.     Tenderness: There is no abdominal tenderness. There is no guarding.     Comments: 4surgical incisions are dry and intact. No erythema. Approximated with surgical glue  Musculoskeletal:     Right lower leg: No edema.     Left lower leg: No edema.  Neurological:     Mental Status: He is alert and oriented to person, place, and time.     Results for orders placed or performed in visit  on 04/10/23  CBC with Differential/Platelet  Result Value Ref Range   WBC 11.6 (H) 4.0 - 10.5 K/uL   RBC 4.18 (L) 4.22 - 5.81 Mil/uL   Hemoglobin 13.3 13.0 - 17.0 g/dL   HCT 30.8 65.7 - 84.6 %   MCV 96.3 78.0 - 100.0 fl   MCHC 33.1 30.0 - 36.0 g/dL   RDW 96.2 95.2 - 84.1 %   Platelets 585.0 (H) 150.0 - 400.0 K/uL   Neutrophils Relative % 58.5 43.0 - 77.0 %   Lymphocytes Relative 26.6 12.0 - 46.0 %   Monocytes Relative 8.5 3.0 - 12.0 %   Eosinophils Relative 5.0 0.0 - 5.0 %   Basophils Relative 1.4 0.0 - 3.0 %   Neutro Abs 6.8 1.4 - 7.7 K/uL   Lymphs Abs 3.1 0.7 - 4.0 K/uL   Monocytes Absolute 1.0 0.1 - 1.0 K/uL   Eosinophils Absolute 0.6 0.0 - 0.7 K/uL   Basophils Absolute 0.2 (H) 0.0 - 0.1 K/uL  Comprehensive  metabolic panel  Result Value Ref Range   Sodium 138 135 - 145 mEq/L   Potassium 4.8 3.5 - 5.1 mEq/L   Chloride 99 96 - 112 mEq/L   CO2 27 19 - 32 mEq/L   Glucose, Bld 79 70 - 99 mg/dL   BUN 10 6 - 23 mg/dL   Creatinine, Ser 3.24 0.40 - 1.50 mg/dL   Total Bilirubin 0.4 0.2 - 1.2 mg/dL   Alkaline Phosphatase 81 39 - 117 U/L   AST 19 0 - 37 U/L   ALT 27 0 - 53 U/L   Total Protein 7.3 6.0 - 8.3 g/dL   Albumin 4.4 3.5 - 5.2 g/dL   GFR 40.10 >27.25 mL/min   Calcium 10.0 8.4 - 10.5 mg/dL      Assessment & Plan:    Problem List Items Addressed This Visit     S/P appendectomy, follow-up exam - Primary    TCM call 04/04/2023 Hospitalization 03/23/23 to 04/03/23: hospitalization was complicated by pelvic abscess and SBO. F/up appointment with surgeon 04/21/2023 Today denies any fever or ABDOMEN pain or nausea. He has completed oral abx prescribed. He reports persistent fatigue, adequate appetite, and last BM yesterday (no constipation or diarrhea).  Reviewed discharge summary, lab results and reconciled med list today Repeat CBC and CMP      Relevant Orders   CBC with Differential/Platelet (Completed)   Comprehensive metabolic panel (Completed)   Return for maintain upcoming appt with me.     Alysia Penna, NP

## 2023-04-11 ENCOUNTER — Encounter: Payer: Self-pay | Admitting: Nurse Practitioner

## 2023-04-11 ENCOUNTER — Other Ambulatory Visit: Payer: Self-pay | Admitting: Nurse Practitioner

## 2023-04-11 DIAGNOSIS — D75839 Thrombocytosis, unspecified: Secondary | ICD-10-CM

## 2023-06-14 ENCOUNTER — Other Ambulatory Visit: Payer: Self-pay | Admitting: Nurse Practitioner

## 2023-06-14 DIAGNOSIS — F411 Generalized anxiety disorder: Secondary | ICD-10-CM

## 2023-07-23 ENCOUNTER — Ambulatory Visit (INDEPENDENT_AMBULATORY_CARE_PROVIDER_SITE_OTHER): Payer: Medicare Other

## 2023-07-23 DIAGNOSIS — Z0000 Encounter for general adult medical examination without abnormal findings: Secondary | ICD-10-CM

## 2023-07-23 NOTE — Patient Instructions (Signed)
[name] , Thank you for taking time to come for your Medicare Wellness Visit. I appreciate your ongoing commitment to your health goals. Please review the following plan we discussed and let me know if I can assist you in the future.   Referrals/Orders/Follow-Ups/Clinician Recommendations: none  This is a list of the screening recommended for you and due dates:  Health Maintenance  Topic Date Due   Hepatitis C Screening  Never done   COVID-19 Vaccine (7 - 2024-25 season) 09/17/2023*   Medicare Annual Wellness Visit  07/22/2024   Colon Cancer Screening  09/14/2026   Pneumonia Vaccine  Completed   Flu Shot  Completed   Zoster (Shingles) Vaccine  Completed   HPV Vaccine  Aged Out   DTaP/Tdap/Td vaccine  Discontinued  *Topic was postponed. The date shown is not the original due date.    Advanced directives: (In Chart) A copy of your advanced directives are scanned into your chart should your provider ever need it.  Next Medicare Annual Wellness Visit scheduled for next year: Yes  insert Preventive Care attachment Insert FALL PREVENTION attachment if needed

## 2023-07-23 NOTE — Progress Notes (Signed)
Subjective:   Nicholas Moody is a 68 y.o. male who presents for Medicare Annual/Subsequent preventive examination.  Visit Complete: Virtual I connected with  Nicholas Moody on 07/23/23 by a audio enabled telemedicine application and verified that I am speaking with the correct person using two identifiers.  Interactive audio and video telecommunications were attempted between this provider and patient, however failed, due to patient having technical difficulties OR patient did not have access to video capability.  We continued and completed visit with audio only. Patient Location: Home  Provider Location: Office/Clinic  I discussed the limitations of evaluation and management by telemedicine. The patient expressed understanding and agreed to proceed.  Vital Signs: Because this visit was a virtual/telehealth visit, some criteria may be missing or patient reported. Any vitals not documented were not able to be obtained and vitals that have been documented are patient reported.  Patient Medicare AWV questionnaire was completed by the patient on 07/20/2023; I have confirmed that all information answered by patient is correct and no changes since this date.  Cardiac Risk Factors include: advanced age (>66men, >38 women);hypertension;male gender     Objective:    Today's Vitals   There is no height or weight on file to calculate BMI.     07/23/2023    8:11 AM 03/24/2023    4:38 AM 03/23/2023    6:28 PM 07/17/2022    8:14 AM 07/13/2021    9:53 AM 05/30/2021    8:34 AM 10/19/2011   11:02 AM  Advanced Directives  Does Patient Have a Medical Advance Directive? Yes No No Yes Yes Yes Patient has advance directive, copy not in chart  Type of Advance Directive Healthcare Power of [place];Living will   Healthcare Power of [place];Living will Healthcare Power of [place];Living will Healthcare Power of [place];Living will;Out of facility DNR (pink MOST or yellow form) Healthcare Power of  [place];Living will  Copy of Healthcare Power of Attorney in Chart? Yes - validated most recent copy scanned in chart (See row information)   Yes - validated most recent copy scanned in chart (See row information) No - copy requested Yes - validated most recent copy scanned in chart (See row information) --  Would patient like information on creating a medical advance directive?  No - Patient declined       Pre-existing out of facility DNR order (yellow form or pink MOST form)      Pink MOST/Yellow Form most recent copy in chart - Physician notified to receive inpatient order No    Current Medications (verified) Outpatient Encounter Medications as of 07/23/2023  Medication Sig   acetaminophen (TYLENOL) 500 MG tablet Take 2 tablets (1,000 mg total) by mouth every 6 (six) hours as needed.   aspirin 325 MG tablet Take 325 mg by mouth every 6 (six) hours as needed for moderate pain (pain score 4-6).   levothyroxine (SYNTHROID) 125 MCG tablet TAKE ONE TABLET BY MOUTH ONE TIME DAILY   lisinopril (ZESTRIL) 20 MG tablet TAKE ONE TABLET BY MOUTH ONE TIME DAILY   methocarbamol (ROBAXIN) 500 MG tablet Take 1 tablet (500 mg total) by mouth every 6 (six) hours as needed for muscle spasms.   sildenafil (VIAGRA) 100 MG tablet TAKE ONE TABLET BY MOUTH ONE TIME DAILY AS NEEDED   oxyCODONE (OXY IR/ROXICODONE) 5 MG immediate release tablet Take 1 tablet (5 mg total) by mouth every 6 (six) hours as needed (pain).   No facility-administered encounter medications on file as of 07/23/2023.  Allergies (verified) Lactose intolerance (gi)   History: Past Medical History:  Diagnosis Date   Acute appendicitis 03/24/2023   Advanced care planning/counseling discussion 05/30/2021   Allergy    Anxiety    Asthma    Cancer (HCC)    skin cancer x 2   Depression    History of renal stone 2011   lithotripsy   Hydrocele of testis    Hypercholesteremia 08/07/2022   Hypertension    Scoliosis    sees chiropractor  monthly   Scoliosis    Thyroid disease    Past Surgical History:  Procedure Laterality Date   COLONOSCOPY     HYDROCELE EXCISION  10/19/2011   Procedure: HYDROCELECTOMY ADULT;  Surgeon: Anner Crete, MD;  [facility]: [facility];  Service: Urology;  Laterality: Left;  left hydrocelectomy , left epididectomy    LAPAROSCOPIC APPENDECTOMY  03/30/2023   Procedure: APPENDECTOMY LAPAROSCOPIC;  Surgeon: Harriette Bouillon, MD;  Location: [facility] OR;  Service: General;;   LAPAROSCOPY N/A 03/30/2023   Procedure: LAPAROSCOPY DIAGNOSTIC;  Surgeon: Harriette Bouillon, MD;  Location: [facility] OR;  Service: General;  Laterality: N/A;   LITHOTRIPSY     x2   LITHOTRIPSY Right    Done twice in 2010   MOHS SURGERY     VASECTOMY     Family History  Problem Relation Age of Onset   Hypertension Mother    Heart disease Mother    Cancer Mother        bone   Rectal cancer Father    Stomach cancer Father    Colon cancer Father    Hypertension Father    Cancer Father        bone and colon   Kidney failure Father    Alcohol abuse Father    Anxiety disorder Father    Early death Father    Colon polyps Neg Hx    Crohn's disease Neg Hx    Esophageal cancer Neg Hx    Social History   Socioeconomic History   Marital status: Married    Spouse name: Not on file   Number of children: Not on file   Years of education: Not on file   Highest education level: Not on file  Occupational History   Not on file  Tobacco Use   Smoking status: Never   Smokeless tobacco: Never  Vaping Use   Vaping status: Never Used  Substance and Sexual Activity   Alcohol use: Not Currently    Comment: 2/week   Drug use: Yes    Types: Marijuana    Comment: Today 08/30/21   Sexual activity: Yes    Birth control/protection: Surgical  Other Topics Concern   Not on file  Social History Narrative   Home with wife.    Social Drivers of Corporate investment banker Strain: Low Risk  (07/23/2023)   Overall Financial  Resource Strain (CARDIA)    Difficulty of Paying Living Expenses: Not hard at all  Food Insecurity: No Food Insecurity (07/23/2023)   Hunger Vital Sign    Worried About Running Out of Food in the Last Year: Never true    Ran Out of Food in the Last Year: Never true  Transportation Needs: No Transportation Needs (07/23/2023)   PRAPARE - Administrator, Civil Service (Medical): No    Lack of Transportation (Non-Medical): No  Physical Activity: Inactive (07/23/2023)   Exercise Vital Sign    Days of Exercise per Week: 0 days  Minutes of Exercise per Session: 0 min  Stress: No Stress Concern Present (07/23/2023)   [employer] of Occupational Health - Occupational Stress Questionnaire    Feeling of Stress : Only a little  Social Connections: Socially Isolated (07/23/2023)   Social Connection and Isolation Panel [NHANES]    Frequency of Communication with Friends and Family: Never    Frequency of Social Gatherings with Friends and Family: Never    Attends Religious Services: Never    Diplomatic Services operational officer: No    Attends Engineer, structural: Never    Marital Status: Married    Tobacco Counseling Counseling given: Not Answered   Clinical Intake:  Pre-visit preparation completed: Yes  Pain : No/denies pain     Nutritional Risks: None Diabetes: No  How often do you need to have someone help you when you read instructions, pamphlets, or other written materials from your doctor or pharmacy?: 1 - Never  Interpreter Needed?: No  Information entered by :: NAllen LPN   Activities of Daily Living    07/20/2023    4:38 PM 03/24/2023    4:39 AM  In your present state of health, do you have any difficulty performing the following activities:  Hearing? 1 0  Comment wears hearing aids   Vision? 0 0  Difficulty concentrating or making decisions? 0 0  Walking or climbing stairs? 0   Dressing or bathing? 0   Doing errands, shopping? 0 0   Preparing Food and eating ? N   Using the Toilet? N   In the past six months, have you accidently leaked urine? N   Do you have problems with loss of bowel control? N   Managing your Medications? N   Managing your Finances? N   Housekeeping or managing your Housekeeping? N     Patient Care Team: Nche, Bonna Gains, NP as PCP - General (Internal Medicine)  Indicate any recent Medical Services you may have received from other than [facility] providers in the past year (date may be approximate).     Assessment:   This is a routine wellness examination for Jakeim.  Hearing/Vision screen Hearing Screening - Comments:: Has hearing aids that are maintained Vision Screening - Comments:: Regular eye exams, [facility]   Goals Addressed             This Visit's Progress    Patient Stated       07/23/2023, regain strength       Depression Screen    07/23/2023    8:12 AM 04/10/2023   11:17 AM 03/08/2023    7:56 AM 09/05/2022    7:52 AM 08/07/2022    8:06 AM 07/17/2022    8:15 AM 07/13/2021    9:55 AM  PHQ 2/9 Scores  PHQ - 2 Score 0 0 2 0 0 0 0  PHQ- 9 Score 3 0 9        Fall Risk    07/20/2023    4:38 PM 04/10/2023   11:17 AM 03/08/2023    7:56 AM 09/05/2022    7:51 AM 08/07/2022    8:06 AM  Fall Risk   Falls in the past year? 0 0 0 0 0  Number falls in past yr: 0 0 0 0 0  Injury with Fall? 0 0 0 0 0  Risk for fall due to : Medication side effect No Fall Risks No Fall Risks No Fall Risks No Fall Risks  Follow up Falls prevention discussed;Falls  evaluation completed Falls evaluation completed Falls evaluation completed Falls evaluation completed Falls evaluation completed    MEDICARE RISK AT HOME: Medicare Risk at Home Any stairs in or around the home?: (Patient-Rptd) Yes If so, are there any without handrails?: (Patient-Rptd) No Home free of loose throw rugs in walkways, pet beds, electrical cords, etc?: (Patient-Rptd) No Adequate lighting in your home to reduce risk of falls?:  (Patient-Rptd) Yes Life alert?: (Patient-Rptd) No Use of a cane, walker or w/c?: (Patient-Rptd) No Grab bars in the bathroom?: (Patient-Rptd) No Shower chair or bench in shower?: (Patient-Rptd) No Elevated toilet seat or a handicapped toilet?: (Patient-Rptd) No  TIMED UP AND GO:  Was the test performed?  No    Cognitive Function:        07/23/2023    8:14 AM 07/17/2022    8:16 AM  6CIT Screen  What Year? 0 points 0 points  What month? 0 points 0 points  What time? 0 points 0 points  Count back from 20 0 points 0 points  Months in reverse 0 points 0 points  Repeat phrase 0 points 2 points  Total Score 0 points 2 points    Immunizations Immunization History  Administered Date(s) Administered   Fluad Quad(high Dose 65+) 04/02/2020, 03/23/2021, 04/19/2022   Fluad Trivalent(High Dose 65+) 03/08/2023   Influenza Whole 03/21/2010   Influenza-Unspecified 03/28/2021   Moderna Sars-Covid-2 Vaccination 08/20/2019, 09/17/2019, 04/20/2020, 01/05/2021, 03/28/2021   Novavax(Covid-19) Vaccine 05/16/2022   PNEUMOCOCCAL CONJUGATE-20 09/27/2020   Td 03/21/2010   Tdap 09/04/2022   Zoster Recombinant(Shingrix) 12/28/2020, 03/28/2021    TDAP status: Up to date  Flu Vaccine status: Up to date  Pneumococcal vaccine status: Up to date  Covid-19 vaccine status: Information provided on how to obtain vaccines.   Qualifies for Shingles Vaccine? Yes   Zostavax completed Yes   Shingrix Completed?: Yes  Screening Tests Health Maintenance  Topic Date Due   Hepatitis C Screening  Never done   COVID-19 Vaccine (7 - 2024-25 season) 09/17/2023 (Originally 02/18/2023)   Medicare Annual Wellness (AWV)  07/22/2024   Colonoscopy  09/14/2026   Pneumonia Vaccine 15+ Years old  Completed   INFLUENZA VACCINE  Completed   Zoster Vaccines- Shingrix  Completed   HPV VACCINES  Aged Out   DTaP/Tdap/Td  Discontinued    Health Maintenance  Health Maintenance Due  Topic Date Due   Hepatitis C  Screening  Never done    Colorectal cancer screening: Type of screening: Colonoscopy. Completed 09/13/2021. Repeat every 5 years  Lung Cancer Screening: (Low Dose CT Chest recommended if Age 17-80 years, 20 pack-year currently smoking OR have quit w/in 15years.) does not qualify.   Lung Cancer Screening Referral: no  Additional Screening:  Hepatitis C Screening: does qualify;   Vision Screening: Recommended annual ophthalmology exams for early detection of glaucoma and other disorders of the eye. Is the patient up to date with their annual eye exam?  Yes  Who is the provider or what is the name of the office in which the patient attends annual eye exams? [facility] If pt is not established with a provider, would they like to be referred to a provider to establish care? No .   Dental Screening: Recommended annual dental exams for proper oral hygiene  Diabetic Foot Exam: n/a  Community Resource Referral / Chronic Care Management: CRR required this visit?  No   CCM required this visit?  No     Plan:     I have personally reviewed  and noted the following in the patient's chart:   Medical and social history Use of alcohol, tobacco or illicit drugs  Current medications and supplements including opioid prescriptions. Patient is not currently taking opioid prescriptions. Functional ability and status Nutritional status Physical activity Advanced directives List of other physicians Hospitalizations, surgeries, and ER visits in previous 12 months Vitals Screenings to include cognitive, depression, and falls Referrals and appointments  In addition, I have reviewed and discussed with patient certain preventive protocols, quality metrics, and best practice recommendations. A written personalized care plan for preventive services as well as general preventive health recommendations were provided to patient.     Barb Merino, LPN   06/24/1094   After Visit Summary: (MyChart) Due to  this being a telephonic visit, the after visit summary with patients personalized plan was offered to patient via MyChart   Nurse Notes: none

## 2023-08-06 ENCOUNTER — Other Ambulatory Visit: Payer: Self-pay | Admitting: Nurse Practitioner

## 2023-08-06 DIAGNOSIS — E039 Hypothyroidism, unspecified: Secondary | ICD-10-CM

## 2023-09-05 ENCOUNTER — Encounter: Payer: Self-pay | Admitting: Nurse Practitioner

## 2023-09-05 ENCOUNTER — Ambulatory Visit: Payer: Medicare Other | Admitting: Nurse Practitioner

## 2023-09-05 VITALS — BP 136/78 | HR 58 | Temp 97.7°F | Ht 68.5 in | Wt 171.4 lb

## 2023-09-05 DIAGNOSIS — E039 Hypothyroidism, unspecified: Secondary | ICD-10-CM

## 2023-09-05 DIAGNOSIS — Z0001 Encounter for general adult medical examination with abnormal findings: Secondary | ICD-10-CM

## 2023-09-05 DIAGNOSIS — E781 Pure hyperglyceridemia: Secondary | ICD-10-CM | POA: Diagnosis not present

## 2023-09-05 DIAGNOSIS — R351 Nocturia: Secondary | ICD-10-CM | POA: Diagnosis not present

## 2023-09-05 DIAGNOSIS — I10 Essential (primary) hypertension: Secondary | ICD-10-CM | POA: Diagnosis not present

## 2023-09-05 DIAGNOSIS — D72829 Elevated white blood cell count, unspecified: Secondary | ICD-10-CM | POA: Diagnosis not present

## 2023-09-05 DIAGNOSIS — H33002 Unspecified retinal detachment with retinal break, left eye: Secondary | ICD-10-CM | POA: Insufficient documentation

## 2023-09-05 DIAGNOSIS — R252 Cramp and spasm: Secondary | ICD-10-CM

## 2023-09-05 LAB — CBC WITH DIFFERENTIAL/PLATELET
Basophils Absolute: 0.1 10*3/uL (ref 0.0–0.1)
Basophils Relative: 0.8 % (ref 0.0–3.0)
Eosinophils Absolute: 0.3 10*3/uL (ref 0.0–0.7)
Eosinophils Relative: 3.7 % (ref 0.0–5.0)
HCT: 39.9 % (ref 39.0–52.0)
Hemoglobin: 14.1 g/dL (ref 13.0–17.0)
Lymphocytes Relative: 33.8 % (ref 12.0–46.0)
Lymphs Abs: 2.9 10*3/uL (ref 0.7–4.0)
MCHC: 35.3 g/dL (ref 30.0–36.0)
MCV: 96.1 fl (ref 78.0–100.0)
Monocytes Absolute: 0.6 10*3/uL (ref 0.1–1.0)
Monocytes Relative: 7.1 % (ref 3.0–12.0)
Neutro Abs: 4.6 10*3/uL (ref 1.4–7.7)
Neutrophils Relative %: 54.6 % (ref 43.0–77.0)
Platelets: 277 10*3/uL (ref 150.0–400.0)
RBC: 4.16 Mil/uL — ABNORMAL LOW (ref 4.22–5.81)
RDW: 12.4 % (ref 11.5–15.5)
WBC: 8.5 10*3/uL (ref 4.0–10.5)

## 2023-09-05 LAB — LIPID PANEL
Cholesterol: 143 mg/dL (ref 0–200)
HDL: 49.1 mg/dL (ref >39.00)
LDL Cholesterol: 69 mg/dL (ref 0–99)
NonHDL: 93.77
Total CHOL/HDL Ratio: 3
Triglycerides: 123 mg/dL (ref 0.0–149.0)
VLDL: 24.6 mg/dL (ref 0.0–40.0)

## 2023-09-05 LAB — RENAL FUNCTION PANEL
Albumin: 4.6 g/dL (ref 3.5–5.2)
BUN: 13 mg/dL (ref 6–23)
CO2: 29 meq/L (ref 19–32)
Calcium: 9.5 mg/dL (ref 8.4–10.5)
Chloride: 104 meq/L (ref 96–112)
Creatinine, Ser: 0.9 mg/dL (ref 0.40–1.50)
GFR: 87.31 mL/min (ref >60.00)
Glucose, Bld: 86 mg/dL (ref 70–99)
Phosphorus: 3.3 mg/dL (ref 2.3–4.6)
Potassium: 4.2 meq/L (ref 3.5–5.1)
Sodium: 140 meq/L (ref 135–145)

## 2023-09-05 LAB — PSA: PSA: 0.92 ng/mL (ref 0.10–4.00)

## 2023-09-05 LAB — T4, FREE: Free T4: 1.1 ng/dL (ref 0.60–1.60)

## 2023-09-05 LAB — TSH: TSH: 0.77 u[IU]/mL (ref 0.35–5.50)

## 2023-09-05 MED ORDER — METHOCARBAMOL 500 MG PO TABS: 500.0000 mg | ORAL_TABLET | Freq: Every evening | ORAL | 0 refills | Status: AC | PRN

## 2023-09-05 MED ORDER — LISINOPRIL 20 MG PO TABS: 20.0000 mg | ORAL_TABLET | Freq: Every day | ORAL | 3 refills | Status: AC

## 2023-09-05 NOTE — Assessment & Plan Note (Signed)
Maintains vegan diet Advised to be cautious about high carb/high fat diet. Also start oral multivitamin 1tab daily Repeat lipid panel

## 2023-09-05 NOTE — Assessment & Plan Note (Signed)
Stable mood, weight, and sleep Repeat Tsh and T4 Maintain Levothyroxine dose

## 2023-09-05 NOTE — Assessment & Plan Note (Signed)
 Chronic, intermittent, worse at hs and interferes with sleep, use of robaxin at hs prn. Refill sent

## 2023-09-05 NOTE — Progress Notes (Signed)
 Complete physical exam  Patient: Nicholas Moody   DOB: 10-Dec-1955   68 y.o. Male  MRN: [id number] Visit Date: 09/05/2023  Subjective:    Chief Complaint  Patient presents with   Annual Exam    Request refill of muscle relaxer    Nicholas Moody is a 68 y.o. male who presents today for a complete physical exam. He reports consuming a low fat and low sodium diet.  Cardio and weight training EOD  He generally feels well. He reports sleeping well. He does have additional problems to discuss today.  Vision:Yes Dental:Yes STD Screen:No PSA:Yes BP Readings from Last 3 Encounters:  09/05/23 136/78  04/10/23 139/86  04/03/23 (!) 144/91   Wt Readings from Last 3 Encounters:  09/05/23 171 lb 6.4 oz (77.7 kg)  04/10/23 169 lb 3.2 oz (76.7 kg)  03/30/23 175 lb 0.7 oz (79.4 kg)    Most recent fall risk assessment:    09/05/2023    8:08 AM  Fall Risk   Falls in the past year? 0  Number falls in past yr: 0  Injury with Fall? 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 Allena Katz  Hypertension BP at goal with lisinopril BP Readings from Last 3 Encounters:  09/05/23 136/78  04/10/23 139/86  04/03/23 (!) 144/91    CheckBMP Maintain med dose F/up in 6months  Hypothyroidism Stable mood, weight, and sleep Repeat Tsh and T4 Maintain Levothyroxine dose  Muscle cramp, nocturnal Chronic, intermittent, worse at hs and interferes with sleep, use of robaxin at hs prn. Refill sent  Hypertriglyceridemia Maintains vegan diet Advised to be cautious about high carb/high fat diet. Also start oral multivitamin 1tab daily Repeat lipid panel   Past Medical History:  Diagnosis Date   Acute  appendicitis 03/24/2023   Advanced care planning/counseling discussion 05/30/2021   Allergy    Anxiety    Asthma    Cancer (HCC)    skin cancer x 2   Depression    History of renal stone 2011   lithotripsy   Hydrocele of testis    Hypercholesteremia 08/07/2022   Hypertension    Scoliosis    sees chiropractor monthly   Scoliosis    Thyroid disease    Past Surgical History:  Procedure Laterality Date   COLONOSCOPY     HYDROCELE EXCISION  10/19/2011   Procedure: HYDROCELECTOMY ADULT;  Surgeon: Anner Crete, MD;  Location: [facility];  Service: Urology;  Laterality: Left;  left hydrocelectomy , left epididectomy    LAPAROSCOPIC APPENDECTOMY  03/30/2023   Procedure: APPENDECTOMY LAPAROSCOPIC;  Surgeon: Harriette Bouillon, MD;  Location: [facility] OR;  Service: General;;   LAPAROSCOPY N/A 03/30/2023   Procedure: LAPAROSCOPY DIAGNOSTIC;  Surgeon: Harriette Bouillon, MD;  Location: [facility] OR;  Service: General;  Laterality: N/A;   LITHOTRIPSY     x2   LITHOTRIPSY Right    Done twice in 2010   MOHS SURGERY     VASECTOMY     Social History   Socioeconomic History   Marital status: Married  Spouse name: Not on file   Number of children: Not on file   Years of education: Not on file   Highest education level: Associate degree: occupational, Scientist, product/process development, or vocational program  Occupational History   Not on file  Tobacco Use   Smoking status: Never   Smokeless tobacco: Never  Vaping Use   Vaping status: Never Used  Substance and Sexual Activity   Alcohol use: Not Currently    Comment: 2/week   Drug use: Yes    Types: Marijuana    Comment: Today 08/30/21   Sexual activity: Yes    Birth control/protection: Surgical  Other Topics Concern   Not on file  Social History Narrative   Home with wife.    Social Drivers of Health   Financial Resource Strain: Medium Risk (09/01/2023)   Overall Financial Resource Strain (CARDIA)    Difficulty of Paying Living Expenses: Somewhat  hard  Food Insecurity: No Food Insecurity (09/01/2023)   Hunger Vital Sign    Worried About Running Out of Food in the Last Year: Never true    Ran Out of Food in the Last Year: Never true  Transportation Needs: No Transportation Needs (09/01/2023)   PRAPARE - Administrator, Civil Service (Medical): No    Lack of Transportation (Non-Medical): No  Physical Activity: Insufficiently Active (09/01/2023)   Exercise Vital Sign    Days of Exercise per Week: 3 days    Minutes of Exercise per Session: 30 min  Stress: Stress Concern Present (09/01/2023)   [employer] of Occupational Health - Occupational Stress Questionnaire    Feeling of Stress : To some extent  Social Connections: Socially Isolated (09/01/2023)   Social Connection and Isolation Panel [NHANES]    Frequency of Communication with Friends and Family: Never    Frequency of Social Gatherings with Friends and Family: Never    Attends Religious Services: Never    Database administrator or Organizations: No    Attends Banker Meetings: Never    Marital Status: Married  Catering manager Violence: Not At Risk (07/23/2023)   Humiliation, Afraid, Rape, and Kick questionnaire    Fear of Current or Ex-Partner: No    Emotionally Abused: No    Physically Abused: No    Sexually Abused: No   Family Status  Relation Name Status   Mother  Deceased   Father BARLOW HARRISON Deceased   Neg Hx  (Not Specified)  No partnership data on file   Family History  Problem Relation Age of Onset   Hypertension Mother    Heart disease Mother    Cancer Mother        bone   Rectal cancer Father    Stomach cancer Father    Colon cancer Father    Hypertension Father    Cancer Father        bone and colon   Kidney failure Father    Alcohol abuse Father    Anxiety disorder Father    Early death Father    Colon polyps Neg Hx    Crohn's disease Neg Hx    Esophageal cancer Neg Hx    Allergies  Allergen Reactions   Lactose  Intolerance (Gi) Diarrhea and Nausea Only    Patient Care Team: Myrl Lazarus, Bonna Gains, NP as PCP - General (Internal Medicine)   Medications: Outpatient Medications Prior to Visit  Medication Sig   acetaminophen (TYLENOL) 500 MG tablet Take 2 tablets (1,000 mg total) by  mouth every 6 (six) hours as needed.   aspirin 325 MG tablet Take 325 mg by mouth every 6 (six) hours as needed for moderate pain (pain score 4-6).   levothyroxine (SYNTHROID) 125 MCG tablet TAKE ONE TABLET BY MOUTH ONE TIME DAILY   sildenafil (VIAGRA) 100 MG tablet TAKE ONE TABLET BY MOUTH ONE TIME DAILY AS NEEDED   [DISCONTINUED] lisinopril (ZESTRIL) 20 MG tablet TAKE ONE TABLET BY MOUTH ONE TIME DAILY   [DISCONTINUED] methocarbamol (ROBAXIN) 500 MG tablet Take 1 tablet (500 mg total) by mouth every 6 (six) hours as needed for muscle spasms.   [DISCONTINUED] oxyCODONE (OXY IR/ROXICODONE) 5 MG immediate release tablet Take 1 tablet (5 mg total) by mouth every 6 (six) hours as needed (pain).   No facility-administered medications prior to visit.    Review of Systems  Constitutional:  Negative for activity change, appetite change and unexpected weight change.  Respiratory: Negative.    Cardiovascular: Negative.   Gastrointestinal: Negative.   Endocrine: Negative for cold intolerance and heat intolerance.  Genitourinary: Negative.   Musculoskeletal: Negative.   Skin: Negative.   Neurological: Negative.   Hematological: Negative.   Psychiatric/Behavioral:  Negative for behavioral problems, decreased concentration, dysphoric mood, hallucinations, self-injury, sleep disturbance and suicidal ideas. The patient is not nervous/anxious.         Objective:  BP 136/78 (BP Location: Right Arm, Patient Position: Sitting, Cuff Size: Normal)   Pulse (!) 58   Temp 97.7 F (36.5 C) (Temporal)   Ht 5' 8.5" (1.74 m)   Wt 171 lb 6.4 oz (77.7 kg)   SpO2 97%   BMI 25.68 kg/m     Physical Exam Vitals and nursing note reviewed.   Constitutional:      General: He is not in acute distress. HENT:     Right Ear: Tympanic membrane, ear canal and external ear normal.     Left Ear: Tympanic membrane, ear canal and external ear normal.     Nose: Nose normal.  Eyes:     Extraocular Movements: Extraocular movements intact.     Conjunctiva/sclera: Conjunctivae normal.     Pupils: Pupils are equal, round, and reactive to light.  Neck:     Thyroid: No thyroid mass, thyromegaly or thyroid tenderness.  Cardiovascular:     Rate and Rhythm: Normal rate and regular rhythm.     Pulses: Normal pulses.     Heart sounds: Normal heart sounds.  Pulmonary:     Effort: Pulmonary effort is normal.     Breath sounds: Normal breath sounds.  Abdominal:     General: Bowel sounds are normal.     Palpations: Abdomen is soft.  Musculoskeletal:        General: Normal range of motion.     Cervical back: Normal range of motion and neck supple.     Right lower leg: No edema.     Left lower leg: No edema.  Lymphadenopathy:     Cervical: No cervical adenopathy.  Skin:    General: Skin is warm and dry.  Neurological:     Mental Status: He is alert and oriented to person, place, and time.     Cranial Nerves: No cranial nerve deficit.  Psychiatric:        Mood and Affect: Mood normal.        Behavior: Behavior normal.        Thought Content: Thought content normal.      No results found for any visits on 09/05/23.  Assessment & Plan:    Routine Health Maintenance and Physical Exam  Immunization History  Administered Date(s) Administered   Fluad Quad(high Dose 65+) 04/02/2020, 03/23/2021, 04/19/2022   Fluad Trivalent(High Dose 65+) 03/08/2023   Fluzone Influenza virus vaccine,trivalent (IIV3), split virus 03/05/2008   Influenza Whole 03/21/2010   Influenza-Unspecified 03/23/2021, 03/28/2021   Moderna Covid-19 Vaccine Bivalent Booster 21yrs & up 07/13/2021   Moderna Sars-Covid-2 Vaccination 08/20/2019, 09/17/2019, 04/20/2020,  01/05/2021, 03/28/2021   Novavax(Covid-19) Vaccine 05/16/2022   PFIZER(Purple Top)SARS-COV-2 Vaccination 09/18/2019, 10/13/2019, 04/17/2020   PNEUMOCOCCAL CONJUGATE-20 09/27/2020   Td 03/21/2010   Tdap 09/04/2022   Unspecified SARS-COV-2 Vaccination 05/16/2022   Zoster Recombinant(Shingrix) 07/24/2020, 09/27/2020, 12/28/2020, 03/28/2021   Health Maintenance  Topic Date Due   Hepatitis C Screening  Never done   COVID-19 Vaccine (11 - 2024-25 season) 09/17/2023 (Originally 02/18/2023)   Medicare Annual Wellness (AWV)  07/22/2024   Colonoscopy  09/14/2026   Pneumonia Vaccine 44+ Years old  Completed   INFLUENZA VACCINE  Completed   Zoster Vaccines- Shingrix  Completed   HPV VACCINES  Aged Out   DTaP/Tdap/Td  Discontinued   Discussed health benefits of physical activity, and encouraged him to engage in regular exercise appropriate for his age and condition.  Problem List Items Addressed This Visit     Hypertension   BP at goal with lisinopril BP Readings from Last 3 Encounters:  09/05/23 136/78  04/10/23 139/86  04/03/23 (!) 144/91    CheckBMP Maintain med dose F/up in 6months      Relevant Medications   lisinopril (ZESTRIL) 20 MG tablet   Hypertriglyceridemia   Maintains vegan diet Advised to be cautious about high carb/high fat diet. Also start oral multivitamin 1tab daily Repeat lipid panel      Relevant Medications   lisinopril (ZESTRIL) 20 MG tablet   Other Relevant Orders   Lipid panel   Hypothyroidism   Stable mood, weight, and sleep Repeat Tsh and T4 Maintain Levothyroxine dose      Relevant Orders   T4, free   TSH   Muscle cramp, nocturnal   Chronic, intermittent, worse at hs and interferes with sleep, use of robaxin at hs prn. Refill sent      Relevant Medications   methocarbamol (ROBAXIN) 500 MG tablet   Other Visit Diagnoses       Encounter for preventative adult health care exam with abnormal findings    -  Primary   Relevant Orders   Renal  Function Panel     Leukocytosis, unspecified type       Relevant Orders   CBC with Differential/Platelet     Nocturia       Relevant Orders   PSA      Return in about 6 months (around 03/07/2024) for HTN, hyperlipidemia (fasting).     Alysia Penna, NP

## 2023-09-05 NOTE — Patient Instructions (Signed)
 Go to lab Maintain Heart healthy diet and daily exercise. Maintain current medications.  Preventive Care 5 Years and Older, Male Preventive care refers to lifestyle choices and visits with your health care provider that can promote health and wellness. Preventive care visits are also called wellness exams. What can I expect for my preventive care visit? Counseling During your preventive care visit, your health care provider may ask about your: Medical history, including: Past medical problems. Family medical history. History of falls. Current health, including: Emotional well-being. Home life and relationship well-being. Sexual activity. Memory and ability to understand (cognition). Lifestyle, including: Alcohol, nicotine or tobacco, and drug use. Access to firearms. Diet, exercise, and sleep habits. Work and work Astronomer. Sunscreen use. Safety issues such as seatbelt and bike helmet use. Physical exam Your health care provider will check your: Height and weight. These may be used to calculate your BMI (body mass index). BMI is a measurement that tells if you are at a healthy weight. Waist circumference. This measures the distance around your waistline. This measurement also tells if you are at a healthy weight and may help predict your risk of certain diseases, such as type 2 diabetes and high blood pressure. Heart rate and blood pressure. Body temperature. Skin for abnormal spots. What immunizations do I need?  Vaccines are usually given at various ages, according to a schedule. Your health care provider will recommend vaccines for you based on your age, medical history, and lifestyle or other factors, such as travel or where you work. What tests do I need? Screening Your health care provider may recommend screening tests for certain conditions. This may include: Lipid and cholesterol levels. Diabetes screening. This is done by checking your blood sugar (glucose) after  you have not eaten for a while (fasting). Hepatitis C test. Hepatitis B test. HIV (human immunodeficiency virus) test. STI (sexually transmitted infection) testing, if you are at risk. Lung cancer screening. Colorectal cancer screening. Prostate cancer screening. Abdominal aortic aneurysm (AAA) screening. You may need this if you are a current or former smoker. Talk with your health care provider about your test results, treatment options, and if necessary, the need for more tests. Follow these instructions at home: Eating and drinking  Eat a diet that includes fresh fruits and vegetables, whole grains, lean protein, and low-fat dairy products. Limit your intake of foods with high amounts of sugar, saturated fats, and salt. Take vitamin and mineral supplements as recommended by your health care provider. Do not drink alcohol if your health care provider tells you not to drink. If you drink alcohol: Limit how much you have to 0-2 drinks a day. Know how much alcohol is in your drink. In the [place] one drink equals one 12 oz bottle of beer (355 mL), one 5 oz glass of wine (148 mL), or one 1 oz glass of hard liquor (44 mL). Lifestyle Brush your teeth every morning and night with fluoride toothpaste. Floss one time each day. Exercise for at least 30 minutes 5 or more days each week. Do not use any products that contain nicotine or tobacco. These products include cigarettes, chewing tobacco, and vaping devices, such as e-cigarettes. If you need help quitting, ask your health care provider. Do not use drugs. If you are sexually active, practice safe sex. Use a condom or other form of protection to prevent STIs. Take aspirin only as told by your health care provider. Make sure that you understand how much to take and what form to  take. Work with your health care provider to find out whether it is safe and beneficial for you to take aspirin daily. Ask your health care provider if you need to take a  cholesterol-lowering medicine (statin). Find healthy ways to manage stress, such as: Meditation, yoga, or listening to music. Journaling. Talking to a trusted person. Spending time with friends and family. Safety Always wear your seat belt while driving or riding in a vehicle. Do not drive: If you have been drinking alcohol. Do not ride with someone who has been drinking. When you are tired or distracted. While texting. If you have been using any mind-altering substances or drugs. Wear a helmet and other protective equipment during sports activities. If you have firearms in your house, make sure you follow all gun safety procedures. Minimize exposure to UV radiation to reduce your risk of skin cancer. What's next? Visit your health care provider once a year for an annual wellness visit. Ask your health care provider how often you should have your eyes and teeth checked. Stay up to date on all vaccines. This information is not intended to replace advice given to you by your health care provider. Make sure you discuss any questions you have with your health care provider. Document Revised: 12/01/2020 Document Reviewed: 12/01/2020 Elsevier Patient Education  2024 [employer].

## 2023-09-05 NOTE — Assessment & Plan Note (Signed)
Under the care of Dr. Posey Pronto  ?

## 2023-09-05 NOTE — Assessment & Plan Note (Signed)
 BP at goal with lisinopril BP Readings from Last 3 Encounters:  09/05/23 136/78  04/10/23 139/86  04/03/23 (!) 144/91    CheckBMP Maintain med dose F/up in 6months

## 2023-09-09 ENCOUNTER — Encounter: Payer: Self-pay | Admitting: Nurse Practitioner

## 2023-12-28 ENCOUNTER — Ambulatory Visit (INDEPENDENT_AMBULATORY_CARE_PROVIDER_SITE_OTHER): Admitting: Radiology

## 2023-12-28 ENCOUNTER — Ambulatory Visit
Admission: EM | Admit: 2023-12-28 | Discharge: 2023-12-28 | Disposition: A | Discharge status: Home / Self Care | Attending: Physician Assistant | Admitting: Physician Assistant

## 2023-12-28 ENCOUNTER — Other Ambulatory Visit: Payer: Self-pay

## 2023-12-28 DIAGNOSIS — S52122A Displaced fracture of head of left radius, initial encounter for closed fracture: Secondary | ICD-10-CM | POA: Diagnosis not present

## 2023-12-28 DIAGNOSIS — W19XXXA Unspecified fall, initial encounter: Secondary | ICD-10-CM | POA: Diagnosis not present

## 2023-12-28 MED ORDER — MELOXICAM 15 MG PO TABS: 15.0000 mg | ORAL_TABLET | Freq: Every day | ORAL | 0 refills | Status: AC

## 2023-12-28 NOTE — ED Triage Notes (Signed)
 Pt presents with complaints of left arm injury today after falling in yard at approximately 4:30 PM. Currently rates overall arm pain a 2/10 at rest (sitting). Pain scale increases with movement of the arm. Aspirin taken PTA with some improvement in left arm swelling. Denies head injury. Lost balance after stepping into a hole on accident.

## 2023-12-28 NOTE — ED Provider Notes (Signed)
 [facility]    CSN: [id number] Arrival date & time: 12/28/23  1859      History   Chief Complaint Chief Complaint  Patient presents with   Arm Injury    HPI QUINTELL BONNIN is a 68 y.o. male.   HPI Pt reports he fell earlier today  He reports he stepped into a hole while walking home and fell over onto his left side. He thinks he may have tried to catch himself with the left arm and now he is having pain and discomfort there He is not sure if there is numbness or tingling in his fingers He denies hitting his head, LOC, nausea or vomiting  He has some abrasions to the palm of the left hand    Past Medical History:  Diagnosis Date   Acute appendicitis 03/24/2023   Advanced care planning/counseling discussion 05/30/2021   Allergy    Anxiety    Asthma    Cancer (HCC)    skin cancer x 2   Depression    History of renal stone 2011   lithotripsy   Hydrocele of testis    Hypercholesteremia 08/07/2022   Hypertension    Scoliosis    sees chiropractor monthly   Scoliosis    Thyroid  disease     Patient Active Problem List   Diagnosis Date Noted   Retinal detachment of left eye due to tear of retina 09/05/2023   Muscle cramp, nocturnal 09/05/2023   S/P appendectomy, follow-up exam 04/10/2023   Sensorineural hearing loss (SNHL) of both ears 08/07/2022   Hypertriglyceridemia 08/07/2022   Advanced care planning/counseling discussion 05/30/2021   Patient is full code 05/30/2021   Flat foot 04/17/2021   Leg length discrepancy 04/17/2021   GAD (generalized anxiety disorder) 03/23/2021   Hypertension 03/23/2021   Hypothyroidism 03/23/2021   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 male 03/23/2021   Idiopathic scoliosis and kyphoscoliosis 03/21/2010   NEPHROLITHIASIS, HX OF 03/21/2010    Past Surgical History:  Procedure Laterality Date   COLONOSCOPY     HYDROCELE EXCISION  10/19/2011   Procedure: HYDROCELECTOMY ADULT;  Surgeon: Norleen JINNY Seltzer, MD;   Location: [facility];  Service: Urology;  Laterality: Left;  left hydrocelectomy , left epididectomy    LAPAROSCOPIC APPENDECTOMY  03/30/2023   Procedure: APPENDECTOMY LAPAROSCOPIC;  Surgeon: Vanderbilt Ned, MD;  Location: [facility] OR;  Service: General;;   LAPAROSCOPY N/A 03/30/2023   Procedure: LAPAROSCOPY DIAGNOSTIC;  Surgeon: Vanderbilt Ned, MD;  Location: [facility] OR;  Service: General;  Laterality: N/A;   LITHOTRIPSY     x2   LITHOTRIPSY Right    Done twice in 2010   MOHS SURGERY     VASECTOMY         Home Medications    Prior to Admission medications   Medication Sig Start Date End Date Taking? Authorizing Provider  meloxicam  (MOBIC ) 15 MG tablet Take 1 tablet (15 mg total) by mouth daily. 12/28/23  Yes Britany Callicott E, PA-C  acetaminophen  (TYLENOL ) 500 MG tablet Take 2 tablets (1,000 mg total) by mouth every 6 (six) hours as needed. 04/03/23   Tammy Sor, PA-C  aspirin 325 MG tablet Take 325 mg by mouth every 6 (six) hours as needed for moderate pain (pain score 4-6).    [provider]  levothyroxine  (SYNTHROID ) 125 MCG tablet TAKE ONE TABLET BY MOUTH ONE TIME DAILY 08/06/23   Nche, Roselie Rockford, NP  lisinopril  (ZESTRIL ) 20 MG tablet Take 1 tablet (20 mg  total) by mouth daily. 09/05/23   Nche, Roselie Rockford, NP  methocarbamol  (ROBAXIN ) 500 MG tablet Take 1 tablet (500 mg total) by mouth at bedtime as needed for muscle spasms. 09/05/23   Nche, Roselie Rockford, NP  sildenafil  (VIAGRA ) 100 MG tablet TAKE ONE TABLET BY MOUTH ONE TIME DAILY AS NEEDED 06/05/22   Nche, Roselie Rockford, NP    Family History Family History  Problem Relation Age of Onset   Hypertension Mother    Heart disease Mother    Cancer Mother        bone   Rectal cancer Father    Stomach cancer Father    Colon cancer Father    Hypertension Father    Cancer Father        bone and colon   Kidney failure Father    Alcohol abuse Father    Anxiety disorder Father    Early death Father     Colon polyps Neg Hx    Crohn's disease Neg Hx    Esophageal cancer Neg Hx     Social History Social History   Tobacco Use   Smoking status: Never   Smokeless tobacco: Never  Vaping Use   Vaping status: Never Used  Substance Use Topics   Alcohol use: Not Currently    Comment: 2/week   Drug use: Yes    Types: Marijuana    Comment: Today 08/30/21     Allergies   Lactose intolerance (gi)   Review of Systems Review of Systems  Musculoskeletal:  Positive for arthralgias and myalgias.       Left arm and elbow pain       Physical Exam Triage Vital Signs ED Triage Vitals  Encounter Vitals Group     BP 12/28/23 1945 123/74     Girls Systolic BP Percentile --      Girls Diastolic BP Percentile --      Boys Systolic BP Percentile --      Boys Diastolic BP Percentile --      Pulse Rate 12/28/23 1945 62     Resp 12/28/23 1945 17     Temp 12/28/23 1945 98.3 F (36.8 C)     Temp Source 12/28/23 1945 Oral     SpO2 12/28/23 1945 96 %     Weight 12/28/23 1948 200 lb (90.7 kg)     Height 12/28/23 1948 5' 7 (1.702 m)     Head Circumference --      Peak Flow --      Pain Score 12/28/23 1947 2     Pain Loc --      Pain Education --      Exclude from Growth Chart --    No data found.  Updated Vital Signs BP 123/74 (BP Location: Right Arm)   Pulse 62   Temp 98.3 F (36.8 C) (Oral)   Resp 17   Ht 5' 7 (1.702 m)   Wt 200 lb (90.7 kg)   SpO2 96%   BMI 31.32 kg/m   Visual Acuity Right Eye Distance:   Left Eye Distance:   Bilateral Distance:    Right Eye Near:   Left Eye Near:    Bilateral Near:     Physical Exam Vitals reviewed.  Constitutional:      General: He is awake.     Appearance: Normal appearance. He is well-developed and well-groomed.  HENT:     Head: Normocephalic and atraumatic.  Eyes:     Extraocular Movements: Extraocular  movements intact.     Conjunctiva/sclera: Conjunctivae normal.  Pulmonary:     Effort: Pulmonary effort is normal.   Musculoskeletal:     Left elbow: Swelling and effusion present. No deformity. Decreased range of motion.     Left forearm: Tenderness present. No swelling, edema, deformity or lacerations.     Right hand: Normal strength.     Left hand: Normal strength. Normal sensation. There is no disruption of two-point discrimination.       Arms:     Cervical back: Normal range of motion.     Comments: Decreased ROM of left elbow- flexion limited to approx 90 degrees. Extension does not appear limited. He has intact 2 point discrimination of left hand and cap refill is less than 2 seconds. Grip strength is 4-5/5 bilaterally. Radial pulse is 2+ and brisk on the left side. He has mild effusion present along the elbow.   Neurological:     Mental Status: He is alert and oriented to person, place, and time.  Psychiatric:        Attention and Perception: Attention normal.        Mood and Affect: Mood normal.        Speech: Speech normal.        Behavior: Behavior normal. Behavior is cooperative.      UC Treatments / Results  Labs (all labs ordered are listed, but only abnormal results are displayed) Labs Reviewed - No data to display  EKG   Radiology DG ELBOW COMPLETE LEFT (3+VIEW) Result Date: 12/28/2023 CLINICAL DATA:  Recent fall with elbow pain, initial encounter EXAM: LEFT ELBOW - COMPLETE 3+ VIEW COMPARISON:  None Available. FINDINGS: Elevation of the anterior and posterior fat pads is seen consistent with joint effusion. There is a minimal cortical irregularity along the lateral aspect of the radial head at the junction with the radial neck. Lucency is also noted within the articular surface of the lateral radial head consistent with minimally displaced fracture. IMPRESSION: Minimally displaced radial head fracture laterally with joint effusion. Electronically Signed   By: Oneil Devonshire M.D.   On: 12/28/2023 20:10    Procedures Procedures (including critical care time)  Medications Ordered  in UC Medications - No data to display  Initial Impression / Assessment and Plan / UC Course  I have reviewed the triage vital signs and the nursing notes.  Pertinent labs & imaging results that were available during my care of the patient were reviewed by me and considered in my medical decision making (see chart for details).     *** Final Clinical Impressions(s) / UC Diagnoses   Final diagnoses:  Closed displaced fracture of head of left radius, initial encounter  Fall, initial encounter     Discharge Instructions      You were seen today for concerns of left arm pain following a fall Your imaging was positive for a minimally displaced radial head fracture. We have placed you in a posterior long arm splint until you can be seen by Orthopedics for further evaluation.  To assist with pain I recommend alternating Tylenol  and an NSAID  I have sent in a script for Meloxicam  for you to take once per day. DO NOT use other NSAIDs while taking this medication (ibuprofen, motrin, aleve, advil, naproxen, etc)  If you notice numbness or tingling of your arm, redness or swelling of the fingers or hand, severe pain in your arm please go to the ED for evaluation  You can contact  the two clinics below for Orthopedic follow up- they are both open on the weekend and have urgent care availability as well as appointments   [facility] [address] [place], [place] [address]  [phone]    [facility] [address], [place], [place] [address] [phone]      ED Prescriptions     Medication Sig Dispense Auth. Provider   meloxicam  (MOBIC ) 15 MG tablet Take 1 tablet (15 mg total) by mouth daily. 30 tablet Abrea Henle E, PA-C      PDMP not reviewed this encounter.

## 2023-12-28 NOTE — Discharge Instructions (Addendum)
 You were seen today for concerns of left arm pain following a fall Your imaging was positive for a minimally displaced radial head fracture. We have placed you in a posterior long arm splint until you can be seen by Orthopedics for further evaluation.  To assist with pain I recommend alternating Tylenol  and an NSAID  I have sent in a script for Meloxicam  for you to take once per day. DO NOT use other NSAIDs while taking this medication (ibuprofen, motrin, aleve, advil, naproxen, etc)  If you notice numbness or tingling of your arm, redness or swelling of the fingers or hand, severe pain in your arm please go to the ED for evaluation  You can contact the two clinics below for Orthopedic follow up- they are both open on the weekend and have urgent care availability as well as appointments   [facility] [address], [place], [place] [address]  [phone]    [facility] [address], [place], [place] [address] [phone]

## 2024-06-04 ENCOUNTER — Other Ambulatory Visit: Payer: Self-pay | Admitting: Nurse Practitioner

## 2024-06-04 DIAGNOSIS — E039 Hypothyroidism, unspecified: Secondary | ICD-10-CM

## 2024-07-25 ENCOUNTER — Ambulatory Visit: Payer: Medicare Other

## 2024-07-28 ENCOUNTER — Ambulatory Visit
